# Patient Record
Sex: Female | Born: 1960 | Race: White | Hispanic: No | Marital: Married | Smoking: Current every day smoker
Health system: Southern US, Community
[De-identification: ages and names within clinical notes are randomized; demographics above are authoritative.]

## PROBLEM LIST (undated history)

## (undated) DIAGNOSIS — K2270 Barrett's esophagus without dysplasia: Secondary | ICD-10-CM

## (undated) DIAGNOSIS — K5790 Diverticulosis of intestine, part unspecified, without perforation or abscess without bleeding: Secondary | ICD-10-CM

## (undated) DIAGNOSIS — M542 Cervicalgia: Secondary | ICD-10-CM

## (undated) DIAGNOSIS — M549 Dorsalgia, unspecified: Secondary | ICD-10-CM

## (undated) DIAGNOSIS — K219 Gastro-esophageal reflux disease without esophagitis: Secondary | ICD-10-CM

## (undated) DIAGNOSIS — F17200 Nicotine dependence, unspecified, uncomplicated: Secondary | ICD-10-CM

## (undated) DIAGNOSIS — I10 Essential (primary) hypertension: Secondary | ICD-10-CM

## (undated) DIAGNOSIS — M25562 Pain in left knee: Secondary | ICD-10-CM

## (undated) DIAGNOSIS — N1831 Chronic kidney disease, stage 3a: Secondary | ICD-10-CM

## (undated) DIAGNOSIS — J449 Chronic obstructive pulmonary disease, unspecified: Secondary | ICD-10-CM

## (undated) DIAGNOSIS — M1990 Unspecified osteoarthritis, unspecified site: Secondary | ICD-10-CM

## (undated) DIAGNOSIS — F988 Other specified behavioral and emotional disorders with onset usually occurring in childhood and adolescence: Secondary | ICD-10-CM

## (undated) DIAGNOSIS — E785 Hyperlipidemia, unspecified: Secondary | ICD-10-CM

## (undated) DIAGNOSIS — F411 Generalized anxiety disorder: Secondary | ICD-10-CM

## (undated) DIAGNOSIS — M79646 Pain in unspecified finger(s): Secondary | ICD-10-CM

## (undated) DIAGNOSIS — N179 Acute kidney failure, unspecified: Secondary | ICD-10-CM

## (undated) DIAGNOSIS — R030 Elevated blood-pressure reading, without diagnosis of hypertension: Secondary | ICD-10-CM

## (undated) DIAGNOSIS — R569 Unspecified convulsions: Secondary | ICD-10-CM

## (undated) DIAGNOSIS — G5600 Carpal tunnel syndrome, unspecified upper limb: Secondary | ICD-10-CM

## (undated) DIAGNOSIS — K5909 Other constipation: Secondary | ICD-10-CM

## (undated) DIAGNOSIS — F909 Attention-deficit hyperactivity disorder, unspecified type: Secondary | ICD-10-CM

## (undated) DIAGNOSIS — Z803 Family history of malignant neoplasm of breast: Secondary | ICD-10-CM

## (undated) DIAGNOSIS — L578 Other skin changes due to chronic exposure to nonionizing radiation: Secondary | ICD-10-CM

## (undated) DIAGNOSIS — R0600 Dyspnea, unspecified: Secondary | ICD-10-CM

## (undated) DIAGNOSIS — Z79899 Other long term (current) drug therapy: Secondary | ICD-10-CM

## (undated) DIAGNOSIS — O039 Complete or unspecified spontaneous abortion without complication: Secondary | ICD-10-CM

## (undated) DIAGNOSIS — E7800 Pure hypercholesterolemia, unspecified: Secondary | ICD-10-CM

## (undated) HISTORY — DX: Other specified behavioral and emotional disorders with onset usually occurring in childhood and adolescence: F98.8

## (undated) HISTORY — PX: NO PAST SURGERIES: SHX2092

## (undated) HISTORY — DX: Dorsalgia, unspecified: M54.9

## (undated) HISTORY — DX: Acute kidney failure, unspecified: N17.9

## (undated) HISTORY — DX: Complete or unspecified spontaneous abortion without complication: O03.9

## (undated) HISTORY — DX: Generalized anxiety disorder: F41.1

## (undated) HISTORY — DX: Hyperlipidemia, unspecified: E78.5

## (undated) HISTORY — DX: Carpal tunnel syndrome, unspecified upper limb: G56.00

## (undated) HISTORY — DX: Pain in left knee: M25.562

## (undated) HISTORY — DX: Chronic kidney disease, stage 3a: N18.31

## (undated) HISTORY — DX: Elevated blood-pressure reading, without diagnosis of hypertension: R03.0

## (undated) HISTORY — DX: Chronic obstructive pulmonary disease, unspecified: J44.9

## (undated) HISTORY — PX: COLONOSCOPY: SHX174

## (undated) HISTORY — DX: Gastro-esophageal reflux disease without esophagitis: K21.9

## (undated) HISTORY — DX: Pain in unspecified finger(s): M79.646

## (undated) HISTORY — DX: Diverticulosis of intestine, part unspecified, without perforation or abscess without bleeding: K57.90

## (undated) HISTORY — DX: Barrett's esophagus without dysplasia: K22.70

## (undated) HISTORY — DX: Other constipation: K59.09

## (undated) HISTORY — PX: ESOPHAGOGASTRODUODENOSCOPY: SHX1529

## (undated) HISTORY — DX: Other skin changes due to chronic exposure to nonionizing radiation: L57.8

## (undated) HISTORY — DX: Nicotine dependence, unspecified, uncomplicated: F17.200

## (undated) HISTORY — DX: Pure hypercholesterolemia, unspecified: E78.00

## (undated) HISTORY — DX: Cervicalgia: M54.2

## (undated) HISTORY — DX: Essential (primary) hypertension: I10

## (undated) HISTORY — DX: Dyspnea, unspecified: R06.00

## (undated) HISTORY — DX: Unspecified osteoarthritis, unspecified site: M19.90

## (undated) HISTORY — DX: Family history of malignant neoplasm of breast: Z80.3

---

## 1998-11-13 ENCOUNTER — Ambulatory Visit (HOSPITAL_COMMUNITY): Admission: RE | Admit: 1998-11-13 | Discharge: 1998-11-13 | Payer: Self-pay | Admitting: *Deleted

## 1998-11-13 ENCOUNTER — Encounter: Payer: Self-pay | Admitting: *Deleted

## 2000-03-29 ENCOUNTER — Encounter: Payer: Self-pay | Admitting: Family Medicine

## 2000-03-29 ENCOUNTER — Other Ambulatory Visit: Admission: RE | Admit: 2000-03-29 | Discharge: 2000-03-29 | Payer: Self-pay | Admitting: Family Medicine

## 2000-03-29 LAB — CONVERTED CEMR LAB: Blood Glucose, Fasting: 82 mg/dL

## 2001-08-17 ENCOUNTER — Encounter: Payer: Self-pay | Admitting: Family Medicine

## 2001-08-17 LAB — CONVERTED CEMR LAB
Blood Glucose, Fasting: 79 mg/dL
TSH: 1.31 microintl units/mL

## 2001-10-11 ENCOUNTER — Emergency Department (HOSPITAL_COMMUNITY): Admission: EM | Admit: 2001-10-11 | Discharge: 2001-10-11 | Payer: Self-pay | Admitting: Emergency Medicine

## 2002-05-08 ENCOUNTER — Encounter: Payer: Self-pay | Admitting: Family Medicine

## 2002-05-08 LAB — CONVERTED CEMR LAB

## 2002-05-20 ENCOUNTER — Emergency Department (HOSPITAL_COMMUNITY): Admission: EM | Admit: 2002-05-20 | Discharge: 2002-05-20 | Payer: Self-pay | Admitting: Emergency Medicine

## 2003-02-09 ENCOUNTER — Encounter: Payer: Self-pay | Admitting: Family Medicine

## 2003-02-09 ENCOUNTER — Other Ambulatory Visit: Admission: RE | Admit: 2003-02-09 | Discharge: 2003-02-09 | Payer: Self-pay | Admitting: Family Medicine

## 2003-02-09 LAB — CONVERTED CEMR LAB: Blood Glucose, Fasting: 82 mg/dL

## 2003-02-12 ENCOUNTER — Encounter: Payer: Self-pay | Admitting: Family Medicine

## 2003-02-12 LAB — CONVERTED CEMR LAB: TSH: 1.14 microintl units/mL

## 2003-03-02 ENCOUNTER — Encounter: Admission: RE | Admit: 2003-03-02 | Discharge: 2003-03-02 | Payer: Self-pay | Admitting: Family Medicine

## 2003-04-29 ENCOUNTER — Emergency Department (HOSPITAL_COMMUNITY): Admission: EM | Admit: 2003-04-29 | Discharge: 2003-04-29 | Payer: Self-pay | Admitting: Emergency Medicine

## 2004-02-20 ENCOUNTER — Ambulatory Visit: Payer: Self-pay | Admitting: Family Medicine

## 2004-03-25 ENCOUNTER — Ambulatory Visit: Payer: Self-pay | Admitting: Family Medicine

## 2004-04-03 ENCOUNTER — Encounter: Admission: RE | Admit: 2004-04-03 | Discharge: 2004-04-03 | Payer: Self-pay | Admitting: Family Medicine

## 2004-04-30 ENCOUNTER — Ambulatory Visit: Payer: Self-pay | Admitting: Family Medicine

## 2004-07-22 ENCOUNTER — Ambulatory Visit: Payer: Self-pay | Admitting: Family Medicine

## 2004-07-22 LAB — CONVERTED CEMR LAB: RBC count: 4.16 10*6/uL

## 2005-01-28 ENCOUNTER — Ambulatory Visit: Payer: Self-pay | Admitting: Gastroenterology

## 2005-03-30 ENCOUNTER — Ambulatory Visit: Payer: Self-pay | Admitting: Family Medicine

## 2006-01-11 ENCOUNTER — Ambulatory Visit: Payer: Self-pay | Admitting: Family Medicine

## 2006-01-11 ENCOUNTER — Encounter: Admission: RE | Admit: 2006-01-11 | Discharge: 2006-01-11 | Payer: Self-pay | Admitting: Family Medicine

## 2006-02-25 ENCOUNTER — Encounter
Admission: RE | Admit: 2006-02-25 | Discharge: 2006-05-26 | Payer: Self-pay | Admitting: Physical Medicine and Rehabilitation

## 2006-07-05 ENCOUNTER — Telehealth: Payer: Self-pay | Admitting: Family Medicine

## 2006-07-13 ENCOUNTER — Telehealth: Payer: Self-pay | Admitting: Family Medicine

## 2006-07-29 ENCOUNTER — Telehealth (INDEPENDENT_AMBULATORY_CARE_PROVIDER_SITE_OTHER): Payer: Self-pay | Admitting: *Deleted

## 2006-07-30 ENCOUNTER — Telehealth (INDEPENDENT_AMBULATORY_CARE_PROVIDER_SITE_OTHER): Payer: Self-pay | Admitting: Internal Medicine

## 2006-07-30 ENCOUNTER — Telehealth: Payer: Self-pay | Admitting: Family Medicine

## 2006-09-01 ENCOUNTER — Ambulatory Visit: Payer: Self-pay | Admitting: Gastroenterology

## 2006-10-12 ENCOUNTER — Encounter: Payer: Self-pay | Admitting: Gastroenterology

## 2006-10-12 ENCOUNTER — Ambulatory Visit: Payer: Self-pay | Admitting: Gastroenterology

## 2006-11-18 ENCOUNTER — Encounter: Admission: RE | Admit: 2006-11-18 | Discharge: 2006-11-18 | Payer: Self-pay | Admitting: Family Medicine

## 2006-11-25 ENCOUNTER — Encounter (INDEPENDENT_AMBULATORY_CARE_PROVIDER_SITE_OTHER): Payer: Self-pay | Admitting: *Deleted

## 2007-01-20 ENCOUNTER — Telehealth (INDEPENDENT_AMBULATORY_CARE_PROVIDER_SITE_OTHER): Payer: Self-pay | Admitting: *Deleted

## 2007-02-08 ENCOUNTER — Telehealth: Payer: Self-pay | Admitting: Family Medicine

## 2007-02-14 ENCOUNTER — Encounter: Payer: Self-pay | Admitting: Family Medicine

## 2007-02-14 DIAGNOSIS — F17200 Nicotine dependence, unspecified, uncomplicated: Secondary | ICD-10-CM | POA: Insufficient documentation

## 2007-02-14 DIAGNOSIS — Z780 Asymptomatic menopausal state: Secondary | ICD-10-CM | POA: Insufficient documentation

## 2007-02-14 DIAGNOSIS — K2270 Barrett's esophagus without dysplasia: Secondary | ICD-10-CM | POA: Insufficient documentation

## 2007-02-14 DIAGNOSIS — M549 Dorsalgia, unspecified: Secondary | ICD-10-CM | POA: Insufficient documentation

## 2007-02-14 DIAGNOSIS — G5603 Carpal tunnel syndrome, bilateral upper limbs: Secondary | ICD-10-CM | POA: Insufficient documentation

## 2007-02-14 DIAGNOSIS — G5600 Carpal tunnel syndrome, unspecified upper limb: Secondary | ICD-10-CM | POA: Insufficient documentation

## 2007-02-16 ENCOUNTER — Ambulatory Visit: Payer: Self-pay | Admitting: Family Medicine

## 2007-02-28 ENCOUNTER — Encounter (INDEPENDENT_AMBULATORY_CARE_PROVIDER_SITE_OTHER): Payer: Self-pay | Admitting: *Deleted

## 2007-03-01 ENCOUNTER — Telehealth: Payer: Self-pay | Admitting: Family Medicine

## 2007-03-02 ENCOUNTER — Ambulatory Visit: Payer: Self-pay | Admitting: Family Medicine

## 2007-03-02 ENCOUNTER — Other Ambulatory Visit: Admission: RE | Admit: 2007-03-02 | Discharge: 2007-03-02 | Payer: Self-pay | Admitting: Family Medicine

## 2007-03-02 ENCOUNTER — Telehealth: Payer: Self-pay | Admitting: Family Medicine

## 2007-03-02 ENCOUNTER — Encounter: Payer: Self-pay | Admitting: Family Medicine

## 2007-03-02 DIAGNOSIS — M79609 Pain in unspecified limb: Secondary | ICD-10-CM

## 2007-03-02 DIAGNOSIS — E7800 Pure hypercholesterolemia, unspecified: Secondary | ICD-10-CM | POA: Insufficient documentation

## 2007-03-02 LAB — CONVERTED CEMR LAB: Pap Smear: NORMAL

## 2007-03-07 ENCOUNTER — Encounter (INDEPENDENT_AMBULATORY_CARE_PROVIDER_SITE_OTHER): Payer: Self-pay | Admitting: *Deleted

## 2007-04-01 ENCOUNTER — Telehealth: Payer: Self-pay | Admitting: Family Medicine

## 2007-04-11 ENCOUNTER — Telehealth: Payer: Self-pay | Admitting: Family Medicine

## 2007-04-18 ENCOUNTER — Telehealth: Payer: Self-pay | Admitting: Family Medicine

## 2007-04-22 ENCOUNTER — Ambulatory Visit: Payer: Self-pay | Admitting: Family Medicine

## 2007-04-22 DIAGNOSIS — F988 Other specified behavioral and emotional disorders with onset usually occurring in childhood and adolescence: Secondary | ICD-10-CM | POA: Insufficient documentation

## 2007-05-06 ENCOUNTER — Ambulatory Visit: Payer: Self-pay | Admitting: Family Medicine

## 2007-05-10 ENCOUNTER — Telehealth: Payer: Self-pay | Admitting: Family Medicine

## 2007-05-17 DIAGNOSIS — F411 Generalized anxiety disorder: Secondary | ICD-10-CM | POA: Insufficient documentation

## 2007-05-17 DIAGNOSIS — Z8379 Family history of other diseases of the digestive system: Secondary | ICD-10-CM

## 2007-05-17 DIAGNOSIS — Z8719 Personal history of other diseases of the digestive system: Secondary | ICD-10-CM | POA: Insufficient documentation

## 2007-05-17 HISTORY — DX: Family history of other diseases of the digestive system: Z83.79

## 2007-06-02 ENCOUNTER — Telehealth: Payer: Self-pay | Admitting: Family Medicine

## 2007-06-02 ENCOUNTER — Telehealth: Payer: Self-pay | Admitting: Gastroenterology

## 2007-06-07 ENCOUNTER — Ambulatory Visit: Payer: Self-pay | Admitting: Family Medicine

## 2007-06-07 DIAGNOSIS — M542 Cervicalgia: Secondary | ICD-10-CM

## 2007-06-21 ENCOUNTER — Ambulatory Visit: Payer: Self-pay | Admitting: Family Medicine

## 2007-06-21 DIAGNOSIS — T7840XA Allergy, unspecified, initial encounter: Secondary | ICD-10-CM | POA: Insufficient documentation

## 2007-06-21 DIAGNOSIS — R030 Elevated blood-pressure reading, without diagnosis of hypertension: Secondary | ICD-10-CM

## 2007-06-21 DIAGNOSIS — Z9109 Other allergy status, other than to drugs and biological substances: Secondary | ICD-10-CM | POA: Insufficient documentation

## 2007-06-28 ENCOUNTER — Telehealth: Payer: Self-pay | Admitting: Gastroenterology

## 2007-07-06 ENCOUNTER — Ambulatory Visit: Payer: Self-pay | Admitting: Family Medicine

## 2007-07-14 ENCOUNTER — Telehealth: Payer: Self-pay | Admitting: Family Medicine

## 2007-07-20 ENCOUNTER — Ambulatory Visit: Payer: Self-pay | Admitting: Family Medicine

## 2007-07-20 DIAGNOSIS — L578 Other skin changes due to chronic exposure to nonionizing radiation: Secondary | ICD-10-CM

## 2007-07-20 DIAGNOSIS — IMO0002 Reserved for concepts with insufficient information to code with codable children: Secondary | ICD-10-CM | POA: Insufficient documentation

## 2007-07-20 DIAGNOSIS — M25569 Pain in unspecified knee: Secondary | ICD-10-CM

## 2007-07-26 ENCOUNTER — Telehealth: Payer: Self-pay | Admitting: Family Medicine

## 2007-08-09 ENCOUNTER — Ambulatory Visit: Payer: Self-pay | Admitting: Gastroenterology

## 2007-08-09 DIAGNOSIS — K219 Gastro-esophageal reflux disease without esophagitis: Secondary | ICD-10-CM

## 2007-08-15 ENCOUNTER — Telehealth: Payer: Self-pay | Admitting: Family Medicine

## 2007-09-05 ENCOUNTER — Telehealth: Payer: Self-pay | Admitting: Family Medicine

## 2007-09-08 ENCOUNTER — Ambulatory Visit: Payer: Self-pay | Admitting: Gastroenterology

## 2007-09-21 ENCOUNTER — Telehealth: Payer: Self-pay | Admitting: Family Medicine

## 2007-10-05 ENCOUNTER — Telehealth: Payer: Self-pay | Admitting: Family Medicine

## 2007-10-21 ENCOUNTER — Telehealth: Payer: Self-pay | Admitting: Family Medicine

## 2007-10-24 ENCOUNTER — Ambulatory Visit: Payer: Self-pay | Admitting: Family Medicine

## 2007-10-24 ENCOUNTER — Telehealth: Payer: Self-pay | Admitting: Family Medicine

## 2007-10-24 DIAGNOSIS — J069 Acute upper respiratory infection, unspecified: Secondary | ICD-10-CM | POA: Insufficient documentation

## 2007-10-28 ENCOUNTER — Telehealth: Payer: Self-pay | Admitting: Family Medicine

## 2007-10-28 ENCOUNTER — Telehealth: Payer: Self-pay | Admitting: Gastroenterology

## 2007-11-14 ENCOUNTER — Telehealth: Payer: Self-pay | Admitting: Family Medicine

## 2007-11-21 ENCOUNTER — Encounter: Payer: Self-pay | Admitting: Gastroenterology

## 2007-11-22 ENCOUNTER — Telehealth: Payer: Self-pay | Admitting: Family Medicine

## 2007-12-02 ENCOUNTER — Telehealth: Payer: Self-pay | Admitting: Gastroenterology

## 2007-12-19 ENCOUNTER — Telehealth: Payer: Self-pay | Admitting: Family Medicine

## 2007-12-27 ENCOUNTER — Telehealth: Payer: Self-pay | Admitting: Gastroenterology

## 2008-01-06 ENCOUNTER — Telehealth (INDEPENDENT_AMBULATORY_CARE_PROVIDER_SITE_OTHER): Payer: Self-pay | Admitting: *Deleted

## 2008-01-30 ENCOUNTER — Telehealth: Payer: Self-pay | Admitting: Family Medicine

## 2008-02-01 ENCOUNTER — Telehealth: Payer: Self-pay | Admitting: Family Medicine

## 2008-02-07 ENCOUNTER — Telehealth: Payer: Self-pay | Admitting: Family Medicine

## 2008-02-17 ENCOUNTER — Telehealth: Payer: Self-pay | Admitting: Gastroenterology

## 2008-02-23 ENCOUNTER — Telehealth: Payer: Self-pay | Admitting: Gastroenterology

## 2008-02-28 ENCOUNTER — Ambulatory Visit: Payer: Self-pay | Admitting: Family Medicine

## 2008-02-28 DIAGNOSIS — M62838 Other muscle spasm: Secondary | ICD-10-CM | POA: Insufficient documentation

## 2008-02-28 DIAGNOSIS — M533 Sacrococcygeal disorders, not elsewhere classified: Secondary | ICD-10-CM | POA: Insufficient documentation

## 2008-03-06 ENCOUNTER — Telehealth: Payer: Self-pay | Admitting: Family Medicine

## 2008-03-13 ENCOUNTER — Telehealth: Payer: Self-pay | Admitting: Family Medicine

## 2008-03-30 ENCOUNTER — Telehealth: Payer: Self-pay | Admitting: Family Medicine

## 2008-04-02 ENCOUNTER — Telehealth: Payer: Self-pay | Admitting: Gastroenterology

## 2008-04-04 ENCOUNTER — Telehealth: Payer: Self-pay | Admitting: Family Medicine

## 2008-04-12 ENCOUNTER — Telehealth: Payer: Self-pay | Admitting: Family Medicine

## 2008-04-13 ENCOUNTER — Telehealth: Payer: Self-pay | Admitting: Family Medicine

## 2008-04-30 ENCOUNTER — Telehealth: Payer: Self-pay | Admitting: Gastroenterology

## 2008-05-01 ENCOUNTER — Telehealth: Payer: Self-pay | Admitting: Family Medicine

## 2008-05-09 ENCOUNTER — Telehealth: Payer: Self-pay | Admitting: Family Medicine

## 2008-05-22 ENCOUNTER — Telehealth: Payer: Self-pay | Admitting: Family Medicine

## 2008-05-22 ENCOUNTER — Telehealth: Payer: Self-pay | Admitting: Gastroenterology

## 2008-05-23 ENCOUNTER — Ambulatory Visit: Payer: Self-pay | Admitting: Family Medicine

## 2008-05-30 ENCOUNTER — Telehealth: Payer: Self-pay | Admitting: Family Medicine

## 2008-06-19 ENCOUNTER — Telehealth: Payer: Self-pay | Admitting: Family Medicine

## 2008-06-25 ENCOUNTER — Telehealth: Payer: Self-pay | Admitting: Family Medicine

## 2008-06-28 ENCOUNTER — Telehealth: Payer: Self-pay | Admitting: Gastroenterology

## 2008-07-17 ENCOUNTER — Telehealth: Payer: Self-pay | Admitting: Family Medicine

## 2008-07-18 ENCOUNTER — Telehealth: Payer: Self-pay | Admitting: Family Medicine

## 2008-08-15 ENCOUNTER — Telehealth: Payer: Self-pay | Admitting: Family Medicine

## 2008-09-10 ENCOUNTER — Telehealth: Payer: Self-pay | Admitting: Family Medicine

## 2008-09-18 ENCOUNTER — Encounter: Admission: RE | Admit: 2008-09-18 | Discharge: 2008-09-18 | Payer: Self-pay | Admitting: Family Medicine

## 2008-09-18 LAB — HM MAMMOGRAPHY

## 2008-10-04 ENCOUNTER — Telehealth: Payer: Self-pay | Admitting: Gastroenterology

## 2008-10-09 ENCOUNTER — Telehealth: Payer: Self-pay | Admitting: Family Medicine

## 2008-10-10 ENCOUNTER — Telehealth: Payer: Self-pay | Admitting: Family Medicine

## 2008-10-31 ENCOUNTER — Telehealth: Payer: Self-pay | Admitting: Family Medicine

## 2008-11-05 ENCOUNTER — Telehealth: Payer: Self-pay | Admitting: Family Medicine

## 2008-11-06 ENCOUNTER — Telehealth: Payer: Self-pay | Admitting: Family Medicine

## 2008-11-13 ENCOUNTER — Encounter: Payer: Self-pay | Admitting: Gastroenterology

## 2008-11-27 ENCOUNTER — Telehealth: Payer: Self-pay | Admitting: Gastroenterology

## 2008-12-05 ENCOUNTER — Telehealth: Payer: Self-pay | Admitting: Family Medicine

## 2008-12-05 ENCOUNTER — Telehealth: Payer: Self-pay | Admitting: Gastroenterology

## 2008-12-31 ENCOUNTER — Telehealth: Payer: Self-pay | Admitting: Family Medicine

## 2009-01-01 ENCOUNTER — Ambulatory Visit: Payer: Self-pay | Admitting: Family Medicine

## 2009-01-02 ENCOUNTER — Telehealth: Payer: Self-pay | Admitting: Gastroenterology

## 2009-01-07 ENCOUNTER — Telehealth: Payer: Self-pay | Admitting: Family Medicine

## 2009-01-29 ENCOUNTER — Telehealth: Payer: Self-pay | Admitting: Family Medicine

## 2009-02-25 ENCOUNTER — Telehealth: Payer: Self-pay | Admitting: Family Medicine

## 2009-03-22 ENCOUNTER — Telehealth: Payer: Self-pay | Admitting: Gastroenterology

## 2009-03-26 ENCOUNTER — Telehealth: Payer: Self-pay | Admitting: Family Medicine

## 2009-04-02 ENCOUNTER — Telehealth: Payer: Self-pay | Admitting: Gastroenterology

## 2009-04-23 ENCOUNTER — Telehealth: Payer: Self-pay | Admitting: Family Medicine

## 2009-05-08 ENCOUNTER — Ambulatory Visit: Payer: Self-pay | Admitting: Family Medicine

## 2009-05-13 ENCOUNTER — Encounter: Payer: Self-pay | Admitting: Family Medicine

## 2009-05-21 ENCOUNTER — Telehealth: Payer: Self-pay | Admitting: Family Medicine

## 2009-05-30 ENCOUNTER — Telehealth: Payer: Self-pay | Admitting: Gastroenterology

## 2009-06-06 ENCOUNTER — Telehealth: Payer: Self-pay | Admitting: Family Medicine

## 2009-06-17 ENCOUNTER — Telehealth: Payer: Self-pay | Admitting: Family Medicine

## 2009-06-26 ENCOUNTER — Telehealth: Payer: Self-pay | Admitting: Gastroenterology

## 2009-07-17 ENCOUNTER — Ambulatory Visit: Payer: Self-pay | Admitting: Family Medicine

## 2009-07-18 ENCOUNTER — Telehealth: Payer: Self-pay | Admitting: Family Medicine

## 2009-07-18 ENCOUNTER — Encounter: Payer: Self-pay | Admitting: Family Medicine

## 2009-07-22 ENCOUNTER — Telehealth: Payer: Self-pay | Admitting: Family Medicine

## 2009-07-22 ENCOUNTER — Encounter: Payer: Self-pay | Admitting: Family Medicine

## 2009-08-06 ENCOUNTER — Telehealth: Payer: Self-pay | Admitting: Gastroenterology

## 2009-08-14 ENCOUNTER — Telehealth: Payer: Self-pay | Admitting: Family Medicine

## 2009-08-30 ENCOUNTER — Telehealth: Payer: Self-pay | Admitting: Gastroenterology

## 2009-09-02 ENCOUNTER — Telehealth: Payer: Self-pay | Admitting: Gastroenterology

## 2009-09-03 ENCOUNTER — Encounter: Payer: Self-pay | Admitting: Gastroenterology

## 2009-09-05 ENCOUNTER — Encounter (INDEPENDENT_AMBULATORY_CARE_PROVIDER_SITE_OTHER): Payer: Self-pay | Admitting: *Deleted

## 2009-09-09 ENCOUNTER — Telehealth: Payer: Self-pay | Admitting: Gastroenterology

## 2009-09-09 ENCOUNTER — Telehealth: Payer: Self-pay | Admitting: Family Medicine

## 2009-09-10 ENCOUNTER — Encounter: Payer: Self-pay | Admitting: Gastroenterology

## 2009-10-02 ENCOUNTER — Telehealth: Payer: Self-pay | Admitting: Gastroenterology

## 2009-10-08 ENCOUNTER — Telehealth: Payer: Self-pay | Admitting: Family Medicine

## 2009-10-11 ENCOUNTER — Ambulatory Visit: Payer: Self-pay | Admitting: Family Medicine

## 2009-10-17 ENCOUNTER — Telehealth: Payer: Self-pay | Admitting: Gastroenterology

## 2009-10-23 ENCOUNTER — Encounter: Payer: Self-pay | Admitting: Gastroenterology

## 2009-11-05 ENCOUNTER — Telehealth: Payer: Self-pay | Admitting: Family Medicine

## 2009-12-01 ENCOUNTER — Emergency Department (HOSPITAL_COMMUNITY)
Admission: AC | Admit: 2009-12-01 | Discharge: 2009-12-01 | Payer: Self-pay | Source: Home / Self Care | Admitting: Emergency Medicine

## 2009-12-02 ENCOUNTER — Ambulatory Visit: Payer: Self-pay | Admitting: Family Medicine

## 2009-12-02 ENCOUNTER — Telehealth: Payer: Self-pay | Admitting: Family Medicine

## 2009-12-02 DIAGNOSIS — Z9189 Other specified personal risk factors, not elsewhere classified: Secondary | ICD-10-CM | POA: Insufficient documentation

## 2009-12-02 DIAGNOSIS — F1210 Cannabis abuse, uncomplicated: Secondary | ICD-10-CM | POA: Insufficient documentation

## 2009-12-03 ENCOUNTER — Telehealth: Payer: Self-pay | Admitting: Family Medicine

## 2009-12-16 ENCOUNTER — Encounter: Payer: Self-pay | Admitting: Family Medicine

## 2009-12-24 ENCOUNTER — Telehealth: Payer: Self-pay | Admitting: Gastroenterology

## 2009-12-31 ENCOUNTER — Telehealth: Payer: Self-pay | Admitting: Family Medicine

## 2010-01-28 ENCOUNTER — Telehealth: Payer: Self-pay | Admitting: Family Medicine

## 2010-02-10 ENCOUNTER — Telehealth: Payer: Self-pay | Admitting: Gastroenterology

## 2010-02-23 ENCOUNTER — Encounter: Payer: Self-pay | Admitting: Family Medicine

## 2010-02-26 ENCOUNTER — Telehealth: Payer: Self-pay | Admitting: Family Medicine

## 2010-03-03 ENCOUNTER — Telehealth: Payer: Self-pay | Admitting: Gastroenterology

## 2010-03-04 NOTE — Progress Notes (Signed)
Summary: samples   Phone Note Call from Patient Call back at [phone]   Caller: Patient Call For: Dr. Arlyce Dice Reason for Call: Talk to Nurse Summary of Call: would like more Aciphex samples Initial call taken by: Vallarie Mare,  Jun 26, 2009 9:41 AM  Follow-up for Phone Call        Returned  pts call, put samples out front until she gets her rx from [facility] Follow-up by: Merri Ray CMA Duncan Dull),  Jun 27, 2009 8:29 AM

## 2010-03-04 NOTE — Letter (Signed)
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) KNEE PAIN, LEFT (ICD-719.46) ALLERGY, ENVIRONMENTAL (ICD-995.3) ELEVATED BLOOD PRESSURE (ICD-796.2) ANXIETY (ICD-300.00) NECK PAIN, CHRONIC (ICD-723.1), with degerative changes from C3-C7 CONSTIPATION, CHRONIC, HX OF (ICD-V12.79) ATTENTION DEFICIT DISORDER (ICD-314.00)- meds per psych as of 12/2009 HYPERCHOLESTEROLEMIA (ICD-272.0) THUMB PAIN, BILATERAL (ICD-729.5) BREAST CANCER, FAMILY HX (ICD-V16.3) BACK PAIN, CHRONIC (ICD-724.5) CARPAL TUNNEL SYNDROME, BILATERAL (ICD-354.0) BARRETTS ESOPHAGUS (ICD-530.85) NICOTINE ADDICTION (ICD-305.1) POSTMENOPAUSAL WITHOUT HORMONE REPLACEMENT THERAPY (ICD-V49.81) GERD (12/30/2009 22:44)       Past History:  Past Medical History: Current Problems:  See note from 12/02/09 [id number] meds 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) KNEE PAIN, LEFT (ICD-719.46) ALLERGY, ENVIRONMENTAL (ICD-995.3) ELEVATED BLOOD PRESSURE (ICD-796.2) ANXIETY (ICD-300.00) NECK PAIN, CHRONIC (ICD-723.1), with degerative changes from C3-C7 CONSTIPATION, CHRONIC, HX OF (ICD-V12.79) ATTENTION DEFICIT DISORDER (ICD-314.00)- meds per psych as of 12/2009 HYPERCHOLESTEROLEMIA (ICD-272.0) THUMB PAIN, BILATERAL (ICD-729.5) BREAST CANCER, FAMILY HX (ICD-V16.3) BACK PAIN, CHRONIC (ICD-724.5) CARPAL TUNNEL SYNDROME, BILATERAL (ICD-354.0) BARRETTS ESOPHAGUS (ICD-530.85) NICOTINE ADDICTION (ICD-305.1) POSTMENOPAUSAL WITHOUT HORMONE REPLACEMENT THERAPY  (ICD-V49.81) GERD

## 2010-03-04 NOTE — Letter (Signed)
Summary: Nadara Eaton letter  [place] at [facility]  [address] [place], [place] [address]   Phone: [phone]  Fax: [phone]       09/05/2009 MRN: [id number]  [facility] [address] [place], [place]  [address]  Dear Ms. [facility] [place], and [place] announce the retirement of Arta Silence, M.D., from full-time practice at the [facility] office effective August 01, 2009 and his plans of returning part-time.  It is important to Dr. Hetty Ely and to our practice that you understand that [facility] has seven physicians in our office for your health care needs.  We will continue to offer the same exceptional care that you have today.    Dr. Hetty Ely has spoken to many of you about his plans for retirement and returning part-time in the fall.   We will continue to work with you through the transition to schedule appointments for you in the office and meet the high standards that [place] is committed to.   Again, it is with great pleasure that we share the news that Dr. Hetty Ely will return to [facility] in October of 2011 with a reduced schedule.    If you have any questions, or would like to request an appointment with one of our physicians, please call us at [phone] and press the option for Scheduling an appointment.  We take pleasure in providing you with excellent patient care and look forward to seeing you at your next office visit.  Our [facility] Physicians are:  Tillman Abide, M.D. Laurita Quint, M.D. Roxy Manns, M.D. Kerby Nora, M.D. Hannah Beat, M.D. Ruthe Mannan, M.D. We proudly welcomed Raechel Ache, M.D. and Eustaquio Boyden, M.D. to the practice in July/August 2011.  Sincerely,  [facility]

## 2010-03-04 NOTE — Progress Notes (Signed)
Summary: wants something for her nerves  Phone Note Call from Patient Call back at [phone],  [phone]   Caller: Patient Call For: Shaune Leeks MD Summary of Call: Pt is under some stress with her father now living close by.  She has some personal issues with him and is upset that he is in town.  She is asking if something for anxiety can be called in for her to [place], just a few of something to last until she can come to terms with the situation. Initial call taken by: [name] CMA,  Jun 06, 2009 11:35 AM  Follow-up for Phone Call        Medicine called to [facility]. Follow-up by: [name] CMA,  Jun 06, 2009 2:33 PM    New/Updated Medications: ATIVAN 1 MG TABS (LORAZEPAM) 1 tab by mouth two times a day as needed anxiety Prescriptions: ATIVAN 1 MG TABS (LORAZEPAM) 1 tab by mouth two times a day as needed anxiety  #30 x 0   Entered and Authorized by:   Shaune Leeks MD   Signed by:   [name] CMA on 06/06/2009   Method used:   Telephoned to ...         RxID:   [id number]

## 2010-03-04 NOTE — Progress Notes (Signed)
Summary: sample request   Phone Note Call from Patient Call back at [phone]   Caller: Patient Call For: Dr. Arlyce Dice Reason for Call: Talk to Nurse Summary of Call: would like to pick up Aciphex samples... going [place] of [place] soon Initial call taken by: Vallarie Mare,  September 09, 2009 11:47 AM  Follow-up for Phone Call        Called pt to inform that samples would be out front. Pt stated she was going to [place] for a week. Explained to her that [facility] resent all paperwork for her Aciphex to be redone because the script was out of date, just resent back in on 8-2 Follow-up by: Merri Ray CMA Duncan Dull),  September 09, 2009 1:35 PM

## 2010-03-04 NOTE — Progress Notes (Signed)
Summary: Aciphex from [facility]   Phone Note Outgoing Call Call back at [facility] Phone [phone]   Call placed by: Merri Ray CMA Duncan Dull),  April 02, 2009 10:44 AM Summary of Call: Called pt to inform that pts Aciphex from [facility] are in, Initial call taken by: Merri Ray CMA Duncan Dull),  April 02, 2009 10:44 AM

## 2010-03-04 NOTE — Progress Notes (Signed)
Summary: needs refills on vicodin, adderall  Phone Note Refill Request Call back at Work Phone [phone] Call back at [phone] Message from:  Patient  Refills Requested: Medication #1:  VICODIN ES 7.5-750 MG TABS Take 1-2 tablet by mouth three times a day sparingly as needed for pain Max 5 tablets daily  Medication #2:  ADDERALL 30 MG  TABS one tab by mouth three times a day Please call pt when ready.  Initial call taken by: [name] CMA,  May 21, 2009 10:32 AM  Follow-up for Phone Call        Pt to pick up scripts. Follow-up by: [name] CMA,  May 21, 2009 3:56 PM    Prescriptions: ADDERALL 30 MG  TABS (AMPHETAMINE-DEXTROAMPHETAMINE) one tab by mouth three times a day  #90 x 0   Entered and Authorized by:   Shaune Leeks MD   Signed by:   Shaune Leeks MD on 05/21/2009   Method used:   Print then Give to Patient   RxID:   [id number] VICODIN ES 7.5-750 MG TABS (HYDROCODONE-ACETAMINOPHEN) Take 1-2 tablet by mouth three times a day sparingly as needed for pain Max 5 tablets daily  #150 x 0   Entered and Authorized by:   Shaune Leeks MD   Signed by:   Shaune Leeks MD on 05/21/2009   Method used:   Print then Give to Patient   RxID:   [id number]

## 2010-03-04 NOTE — Medication Information (Signed)
Summary: Aciphex/[facility].  Aciphex/[facility].   Imported By: Sherian Rein 10/01/2009 08:48:19  _____________________________________________________________________  External Attachment:    Type:   Image     Comment:   External Document

## 2010-03-04 NOTE — Progress Notes (Signed)
Summary: refil request for vicodin  Phone Note Refill Request Message from:  Fax from Pharmacy  Refills Requested: Medication #1:  VICODIN ES 7.5-750 MG TABS Take 1-2 tablet by mouth three times a day sparingly as needed for pain Max 5 tablets daily- talk to Dr. Para March before rxing   Last Refilled: 12/04/2009 Faxed request from [facility].     Initial call taken by: [name] CMA, AAMA,  December 31, 2009 11:19 AM  Follow-up for Phone Call         please call in.  VICODIN ES 7.5-750 MG TABS Take 1-2 tablet by mouth three times a day sparingly as needed for pain Max 5 tablets daily,  ~150, no rf.   thanks.  Follow-up by: Crawford Givens MD,  December 31, 2009 1:33 PM  Additional Follow-up for Phone Call Additional follow up Details #1::        Medication phoned to pharmacy.  Additional Follow-up by: Delilah Shan CMA Duncan Dull),  December 31, 2009 4:52 PM

## 2010-03-04 NOTE — Progress Notes (Signed)
Summary: Rx Adderall and Vicodin  Phone Note Call from Patient   Caller: Patient Call For: [phone] Reason for Call: Refill Medication Summary of Call: Need medication refill for Adderral and Vicodin.[name] KitchenMarland KitchenPts call back # [phone].[name] KitchenDaine Gip  Jun 17, 2009 3:47 PM  Initial call taken by: Daine Gip,  Jun 17, 2009 3:47 PM  Follow-up for Phone Call        Scripts written. Not really due until the 19th. Follow-up by: Shaune Leeks MD,  Jun 17, 2009 4:54 PM  Additional Follow-up for Phone Call Additional follow up Details #1::        Patient notified as instructed by telephone. Rx's up front and ready for pickup. Additional Follow-up by: Sydell Axon LPN,  Jun 17, 2009 5:34 PM    Prescriptions: VICODIN ES 7.5-750 MG TABS (HYDROCODONE-ACETAMINOPHEN) Take 1-2 tablet by mouth three times a day sparingly as needed for pain Max 5 tablets daily  #150 x 0   Entered and Authorized by:   Shaune Leeks MD   Signed by:   Shaune Leeks MD on 06/17/2009   Method used:   Print then Give to Patient   RxID:   [id number] ADDERALL 30 MG  TABS (AMPHETAMINE-DEXTROAMPHETAMINE) one tab by mouth three times a day  #90 x 0   Entered and Authorized by:   Shaune Leeks MD   Signed by:   Shaune Leeks MD on 06/17/2009   Method used:   Print then Give to Patient   RxID:   [id number]

## 2010-03-04 NOTE — Progress Notes (Signed)
Summary: Aciphex from [facility]   Phone Note Outgoing Call Call back at [facility] Phone [phone]   Call placed by: Merri Ray CMA Duncan Dull),  October 17, 2009 3:33 PM Summary of Call: Called pt to inform more Aciphex is here from the [facility]. Initial call taken by: Merri Ray CMA Duncan Dull),  October 17, 2009 3:34 PM

## 2010-03-04 NOTE — Letter (Signed)
Summary: Endoscopy Letter  [facility]  [address] [place], [place] [address]   Phone: [phone]  Fax: [phone]      October 23, 2009 MRN: [id number]   MITTIE KNITTEL [address] [place], [place]  [address]   Dear Ms. Muralles,   According to your medical record, it is time for you to schedule an Endoscopy. Endoscopic screening is recommended for patients with certain upper digestive tract conditions because of associated increased risk for cancers of the upper digestive system.  This letter has been generated based on the recommendations made at the time of your prior procedure. If you feel that in your particular situation this may no longer apply, please contact our office.  Please call our office at [phone]) to schedule this appointment or to update your records at your earliest convenience.  Thank you for cooperating with [place] to provide you with the very best care possible.   Sincerely,  Barbette Hair. Arlyce Dice, M.D.  [facility] [phone]

## 2010-03-04 NOTE — Progress Notes (Signed)
Summary: written rx and note  Phone Note Refill Request Call back at [phone] Message from:  Patient on July 18, 2009 9:09 AM  Refills Requested: Medication #1:  VICODIN ES 7.5-750 MG TABS Take 1-2 tablet by mouth three times a day sparingly as needed for pain Max 5 tablets daily  Medication #2:  ADDERALL 30 MG  TABS one tab by mouth three times a day Patient needs written script and a note for work as well. She says that she forgot to ask you yesterday because she was in shock after hearing that you were leaving at the end of the month. She says that she would like to be out of work through the weekend because she works 12 hrs shift and thinks it will be too much for her. Please call patient when ready.   Initial call taken by: Melody Comas,  July 18, 2009 9:11 AM Caller: Patient Call For: Shaune Leeks MD  Follow-up for Phone Call        Advised patient of rx and note. Placed in front office.  Follow-up by: Melody Comas,  July 18, 2009 1:51 PM    Prescriptions: ADDERALL 30 MG  TABS (AMPHETAMINE-DEXTROAMPHETAMINE) one tab by mouth three times a day  #90 x 0   Entered and Authorized by:   Shaune Leeks MD   Signed by:   Shaune Leeks MD on 07/18/2009   Method used:   Print then Give to Patient   RxID:   [id number] VICODIN ES 7.5-750 MG TABS (HYDROCODONE-ACETAMINOPHEN) Take 1-2 tablet by mouth three times a day sparingly as needed for pain Max 5 tablets daily  #150 x 0   Entered and Authorized by:   Shaune Leeks MD   Signed by:   Shaune Leeks MD on 07/18/2009   Method used:   Print then Give to Patient   RxID:   [id number]

## 2010-03-04 NOTE — Progress Notes (Signed)
Summary: Samples of Aciphex   Phone Note Call from Patient Call back at [phone]   Caller: Patient Call For: Dr. Arlyce Dice Summary of Call: Wants to know if we have any samples of Aciphex Initial call taken by: Karna Christmas,  August 06, 2009 8:41 AM  Follow-up for Phone Call        Returned pts call, explained to her that her samples will be out front,She just mailed off forms to Patient Assistance Program for her Aciphex Follow-up by: Merri Ray CMA Duncan Dull),  August 07, 2009 8:42 AM

## 2010-03-04 NOTE — Progress Notes (Signed)
Summary: Rx Adderall and Vicodin  Phone Note Call from Patient Call back at [phone]   Caller: Patient Call For: Dr. Para March Summary of Call: Patient needs a refill on her Adderall and Hydrocodone. Call when rx's are ready. Initial call taken by: Sydell Axon LPN,  October 08, 2009 9:51 AM  Follow-up for Phone Call        deny both.  see 08/14/09 note.  have patient schedule appointment.   Follow-up by: Crawford Givens MD,  October 08, 2009 2:11 PM  Additional Follow-up for Phone Call Additional follow up Details #1::        Patient Advised.  Lugene Fuquay CMA (AAMA)  October 08, 2009 2:26 PM

## 2010-03-04 NOTE — Progress Notes (Signed)
Summary: needs early refills  Phone Note Refill Request Call back at [phone] Message from:  Patient  Refills Requested: Medication #1:  VICODIN ES 7.5-750 MG TABS Take 1-2 tablet by mouth three times a day sparingly as needed for pain Max 5 tablets daily  Medication #2:  ADDERALL 30 MG  TABS one tab by mouth three times a day Pt's brother in law died so she has to fly to [place] [place].  She is asking for early refills on meds.  Also, she is asking for a couple of valium to help her with the flight and funeral.  Initial call taken by: [name] CMA,  September 09, 2009 8:40 AM  Follow-up for Phone Call        signed.  I printed extra rxs for ativan.  She can take that as needed for flight and anxiety.  I would not use 2 different benzodiazepines.  Follow-up by: Crawford Givens MD,  September 09, 2009 8:59 AM  Additional Follow-up for Phone Call Additional follow up Details #1::        Patient Advised. Prescription left at front desk.  Additional Follow-up by: Delilah Shan CMA (AAMA),  September 09, 2009 10:09 AM    Prescriptions: ATIVAN 1 MG TABS (LORAZEPAM) 1 tab by mouth two times a day as needed anxiety  #15 x 0   Entered and Authorized by:   Crawford Givens MD   Signed by:   Crawford Givens MD on 09/09/2009   Method used:   Print then Give to Patient   RxID:   [id number] ADDERALL 30 MG  TABS (AMPHETAMINE-DEXTROAMPHETAMINE) one tab by mouth three times a day  #90 x 0   Entered and Authorized by:   Crawford Givens MD   Signed by:   Crawford Givens MD on 09/09/2009   Method used:   Print then Give to Patient   RxID:   [id number] VICODIN ES 7.5-750 MG TABS (HYDROCODONE-ACETAMINOPHEN) Take 1-2 tablet by mouth three times a day sparingly as needed for pain Max 5 tablets daily  #150 x 0   Entered and Authorized by:   Crawford Givens MD   Signed by:   Crawford Givens MD on 09/09/2009   Method used:   Print then Give to Patient   RxID:   [id number]

## 2010-03-04 NOTE — Progress Notes (Signed)
Summary: refill request for vicodin, adderall  Phone Note Refill Request Call back at [phone] Message from:  Patient  Refills Requested: Medication #1:  ADDERALL 30 MG  TABS one tab by mouth three times a day  Medication #2:  VICODIN ES 7.5-750 MG TABS Take 1-2 tablet by mouth three times a day sparingly as needed for pain Max 5 tablets daily Please call pt when ready, she is going out of town, asking for these ASAP.  Initial call taken by: [name] CMA,  April 23, 2009 10:46 AM    Prescriptions: VICODIN ES 7.5-750 MG TABS (HYDROCODONE-ACETAMINOPHEN) Take 1-2 tablet by mouth three times a day sparingly as needed for pain Max 5 tablets daily  #150 x 0   Entered and Authorized by:   Ruthe Mannan MD   Signed by:   Ruthe Mannan MD on 04/23/2009   Method used:   Print then Give to Patient   RxID:   [id number] ADDERALL 30 MG  TABS (AMPHETAMINE-DEXTROAMPHETAMINE) one tab by mouth three times a day  #90 x 0   Entered and Authorized by:   Ruthe Mannan MD   Signed by:   Ruthe Mannan MD on 04/23/2009   Method used:   Print then Give to Patient   RxID:   [id number]   Appended Document: refill request for vicodin, adderall Patient Advised. Prescription left at front desk.

## 2010-03-04 NOTE — Medication Information (Signed)
Summary: Assistance forms/[facility]  Assistance forms/[employer]   Imported By: Sherian Rein 09/06/2009 10:34:20  _____________________________________________________________________  External Attachment:    Type:   Image     Comment:   External Document

## 2010-03-04 NOTE — Progress Notes (Signed)
Summary: refill   Phone Note Call from Patient Call back at [phone]   Caller: Patient Call For: Arlyce Dice Reason for Call: Refill Medication, Talk to Nurse Summary of Call: Patient needs refill for her Aciphex would like to speak to CMA first. Initial call taken by: Tawni Levy,  August 30, 2009 8:41 AM  Follow-up for Phone Call        Returned pts call. L/M for pt to return my call Follow-up by: Merri Ray CMA Duncan Dull),  August 30, 2009 1:36 PM  Additional Follow-up for Phone Call Additional follow up Details #1::        Pt wanted to know if her Aciphex from [facility] has came in. Explained to her that she needs to call [facility] and see what is taking so long for the delivery,all paperwork already been submitted. Pt to call [facility]. We are temporarily out of samples. Additional Follow-up by: Merri Ray CMA Duncan Dull),  August 30, 2009 1:53 PM

## 2010-03-04 NOTE — Progress Notes (Signed)
Summary: needs refills on adderall, vicodin  Phone Note Refill Request Call back at Home Phone [phone] Message from:  Patient  Refills Requested: Medication #1:  VICODIN ES 7.5-750 MG TABS Take 1-2 tablet by mouth three times a day sparingly as needed for pain Max 5 tablets daily  Medication #2:  ADDERALL 30 MG  TABS one tab by mouth three times a day Please call when ready.  Initial call taken by: [name] CMA,  August 14, 2009 10:38 AM  Follow-up for Phone Call        okay to fill both with the same sig as previous.  1 month supply on both, #150 of vicodin and #90 of adderall.  Have patient schedule for 30 min appointment with me in the next month.  please print rxs for me to sign.  thanks.  Follow-up by: Crawford Givens MD,  August 14, 2009 11:26 AM     Appended Document: needs refills on adderall, vicodin In your in box for signature.  Appended Document: needs refills on adderall, vicodin Signed, please have patient pick up.   Appended Document: needs refills on adderall, vicodin Patient Advised. Prescription left at front desk.

## 2010-03-04 NOTE — Progress Notes (Signed)
Summary: refill request for vicodin and adderall  Phone Note Refill Request Call back at Work Phone [phone] Call back at [phone] Message from:  Patient  Refills Requested: Medication #1:  VICODIN ES 7.5-750 MG TABS Take 1-2 tablet by mouth three times a day sparingly as needed for pain Max 5 tablets daily  Medication #2:  ADDERALL 30 MG  TABS one tab by mouth three times a day Please call pt when ready.  Initial call taken by: [name] CMA,  March 26, 2009 12:30 PM  Follow-up for Phone Call        Advised pt ok to pick up. Follow-up by: [name] CMA,  March 26, 2009 12:58 PM    Prescriptions: ADDERALL 30 MG  TABS (AMPHETAMINE-DEXTROAMPHETAMINE) one tab by mouth three times a day  #90 x 0   Entered and Authorized by:   Shaune Leeks MD   Signed by:   Shaune Leeks MD on 03/26/2009   Method used:   Print then Give to Patient   RxID:   [id number] VICODIN ES 7.5-750 MG TABS (HYDROCODONE-ACETAMINOPHEN) Take 1-2 tablet by mouth three times a day sparingly as needed for pain Max 5 tablets daily  #150 x 0   Entered and Authorized by:   Shaune Leeks MD   Signed by:   Shaune Leeks MD on 03/26/2009   Method used:   Print then Give to Patient   RxID:   [id number]
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
## 2010-03-04 NOTE — Progress Notes (Signed)
Summary: Aciphex here from PAP   Phone Note Outgoing Call Call back at [facility] Phone [phone]   Call placed by: Merri Ray CMA Duncan Dull),  October 02, 2009 1:56 PM Summary of Call: Called pt to inform that Aciphex was here from the patient assistant program Initial call taken by: Merri Ray CMA Duncan Dull),  October 02, 2009 1:57 PM

## 2010-03-04 NOTE — Progress Notes (Signed)
Summary: Aciphex   Phone Note Call from Patient Call back at [phone]   Caller: Patient Call For: Dr. Arlyce Dice Reason for Call: Refill Medication Summary of Call: pt would like to know if her Aciphex came in and if not, can she have some samples Initial call taken by: Vallarie Mare,  March 22, 2009 10:37 AM  Follow-up for Phone Call        Aciphex has not came in yet can come get samples Follow-up by: Merri Ray CMA Duncan Dull),  March 22, 2009 10:53 AM
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
## 2010-03-04 NOTE — Progress Notes (Signed)
Summary: refill request for vicodin  Phone Note Refill Request Message from:  Fax from Pharmacy  Refills Requested: Medication #1:  VICODIN ES 7.5-750 MG TABS Take 1-2 tablet by mouth three times a day sparingly as needed for pain Max 5 tablets daily- talk to Dr. Para March before rxing   Last Refilled: 11/05/2009 Faxed request from [facility].  Initial call taken by: [name] CMA, AAMA,  December 03, 2009 2:32 PM  Follow-up for Phone Call        please call in.  VICODIN ES 7.5-750 MG TABS Take 1-2 tablet by mouth three times a day sparingly as needed for pain Max 5 tablets daily,  ~150, no rf.  Follow-up by: Crawford Givens MD,  December 03, 2009 10:33 PM  Additional Follow-up for Phone Call Additional follow up Details #1::        Rx called in as directed Additional Follow-up by: Janee Morn CMA Duncan Dull),  December 04, 2009 2:57 PM
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
## 2010-03-04 NOTE — Progress Notes (Signed)
Summary: Samples of Aciphex   Phone Note Call from Patient Call back at [phone]   Caller: Patient Call For: Dr. Arlyce Dice Reason for Call: Talk to Nurse Summary of Call: Asking for samples of Aciphex Initial call taken by: Karna Christmas,  May 30, 2009 2:47 PM  Follow-up for Phone Call        Called pt to inform Dr Arlyce Dice will sign PAP forms on Monday that she could pick up samples today. Will mail her forms to her once Dr Nita Sells signs Follow-up by: Merri Ray CMA Duncan Dull),  May 30, 2009 3:03 PM     Appended Document: Samples of Aciphex Mailed pt her PAP forms today

## 2010-03-04 NOTE — Progress Notes (Signed)
Summary: needs refills on vicodin, adderall  Phone Note Refill Request Call back at [phone] Message from:  Patient  Refills Requested: Medication #1:  VICODIN ES 7.5-750 MG TABS Take 1-2 tablet by mouth three times a day sparingly as needed for pain Max 5 tablets daily  Medication #2:  ADDERALL 30 MG  TABS one tab by mouth three times a day Initial call taken by: [name] CMA,  November 05, 2009 9:11 AM  Follow-up for Phone Call        rxs signed.  Follow-up by: Crawford Givens MD,  November 05, 2009 2:54 PM    Prescriptions: ADDERALL 30 MG  TABS (AMPHETAMINE-DEXTROAMPHETAMINE) one tab by mouth three times a day  #90 x 0   Entered and Authorized by:   Crawford Givens MD   Signed by:   Crawford Givens MD on 11/05/2009   Method used:   Print then Give to Patient   RxID:   [id number] VICODIN ES 7.5-750 MG TABS (HYDROCODONE-ACETAMINOPHEN) Take 1-2 tablet by mouth three times a day sparingly as needed for pain Max 5 tablets daily  #150 x 0   Entered and Authorized by:   Crawford Givens MD   Signed by:   Crawford Givens MD on 11/05/2009   Method used:   Print then Give to Patient   RxID:   [id number]

## 2010-03-04 NOTE — Progress Notes (Signed)
Summary: RX Vicodin and Adderall  Phone Note Refill Request Call back at Work Phone [phone] Message from:  Patient on February 25, 2009 10:39 AM  Refills Requested: Medication #1:  ADDERALL 30 MG  TABS one tab by mouth three times a day  Medication #2:  VICODIN ES 7.5-750 MG TABS Take 1-2 tablet by mouth three times a day sparingly as needed for pain Max 5 tablets daily Patient has new insurance company and needs to come in a pick up written scripts so that she can mail them in. Please call patient when ready for pick up.    Method Requested: Pick up at Office Initial call taken by: Melody Comas,  February 25, 2009 10:41 AM  Follow-up for Phone Call        Patient notified that rx is up front and ready for pickup. Follow-up by: Sydell Axon LPN,  February 25, 2009 3:25 PM    Prescriptions: ADDERALL 30 MG  TABS (AMPHETAMINE-DEXTROAMPHETAMINE) one tab by mouth three times a day  #90 x 0   Entered and Authorized by:   Shaune Leeks MD   Signed by:   Shaune Leeks MD on 02/25/2009   Method used:   Print then Give to Patient   RxID:   [id number] VICODIN ES 7.5-750 MG TABS (HYDROCODONE-ACETAMINOPHEN) Take 1-2 tablet by mouth three times a day sparingly as needed for pain Max 5 tablets daily  #150 x 0   Entered and Authorized by:   Shaune Leeks MD   Signed by:   Shaune Leeks MD on 02/25/2009   Method used:   Print then Give to Patient   RxID:   [id number]

## 2010-03-04 NOTE — Letter (Signed)
Summary: Out of Work  [employer] at [facility]  [address] [place], [place] [address]   Phone: [phone]  Fax: [phone]    July 18, 2009   Employee:  Cariann Lasala    To Whom It May Concern:   For Medical reasons, please excuse the above named employee from work for the following dates:  Start:   07/17/2009  End:   07/21/2009  If you need additional information, please feel free to contact our office.         Sincerely,    Shaune Leeks MD

## 2010-03-04 NOTE — Progress Notes (Signed)
Summary: Are her meds here?   Phone Note Call from Patient Call back at [facility] Phone [phone]   Call For: Dr Arlyce Dice Summary of Call: Wonders if her medicine has arrived yet. Initial call taken by: Leanor Kail [facility],  December 24, 2009 10:56 AM  Follow-up for Phone Call        Returned pts call, explained to pt that we have not revieved her meds from PAP yet offered pt samples until med gets here Follow-up by: Merri Ray CMA Duncan Dull),  December 24, 2009 1:50 PM

## 2010-03-04 NOTE — Progress Notes (Signed)
Summary: Resend app for PAP   Phone Note Other Incoming Call back at [employer] [facility]   Summary of Call: Recieved fax from PAP regarding Melanies Aciphex, Need to resend with a current dat and a written prescription with application and remail.  Initial call taken by: Merri Ray CMA Duncan Dull),  September 02, 2009 10:24 AM  Follow-up for Phone Call        Will get Dr Arlyce Dice to sign original signeitures and remail Follow-up by: Merri Ray CMA Duncan Dull),  September 02, 2009 10:42 AM    Prescriptions: ACIPHEX 20 MG TBEC (RABEPRAZOLE SODIUM) Take 1 tablet by mouth two times a day  #60 x 11   Entered by:   Merri Ray CMA (AAMA)   Authorized by:   Louis Meckel MD   Signed by:   Merri Ray CMA (AAMA) on 09/02/2009   Method used:   Print then Give to Patient   RxID:   [id number]

## 2010-03-04 NOTE — Progress Notes (Signed)
Summary: note for work  Phone Note Call from Patient Call back at [phone]   Caller: Patient Call For: Shaune Leeks MD Summary of Call: Patient is asking if she can get a note for work that is dated for the 12th through the 20th  instead of the 15th through the 19th.  Initial call taken by: Melody Comas,  July 22, 2009 9:10 AM  Follow-up for Phone Call        written. Shaune Leeks MD  July 22, 2009 1:54 PM   Patient advised, letter in front office.  Follow-up by: Melody Comas,  July 22, 2009 2:24 PM

## 2010-03-04 NOTE — Assessment & Plan Note (Signed)
Summary: CONGESTION,ST/CLE   Vital Signs:  Patient profile:   50 year old female Weight:      145.25 pounds Temp:     98.1 degrees F oral Pulse rate:   72 / minute Pulse rhythm:   regular BP sitting:   124 / 80  (left arm) Cuff size:   regular  Vitals Entered By: Sydell Axon LPN (May 09, 5282 10:42 AM) CC: Nasal congestion/yellow-clear, productive cough/yellow, sore throat and no voice for 3 weeks   History of Present Illness: Pt here for congestion, has had loss of voice for three weeks and works in [employer]. Her sxs have been going on for three weeks. She has no headache, she has been "hot"  and has had chills esp at night, no ear pain, ST that has been the major problem, no rhinitis, some nasal congestion, cough that ids proiductive of fowl mucous, some SOB, mild nausea that is not uncommon and hon  and hoarseness that is better today. She has taken IBP and Tyl And ASA and has used cough drops.   Problems Prior to Update: 1)  Muscle Spasm, Trapezius Muscle, Right  (ICD-728.85) 2)  Coccygeal Pain  (ICD-724.79) 3)  Uri  (ICD-465.9) 4)  Fm Hx Malignant Neoplasm Gastrointestinal Tract  (ICD-V16.0) 5)  Esophageal Reflux  (ICD-530.81) 6)  Abrasion, Head  (ICD-910.0) 7)  Other Dermatitis Due To Solar Radiation  (ICD-692.79) 8)  Knee Pain, Left  (ICD-719.46) 9)  Allergy, Environmental  (ICD-995.3) 10)  Elevated Blood Pressure  (ICD-796.2) 11)  Anxiety  (ICD-300.00) 12)  Neck Pain, Chronic  (ICD-723.1) 13)  Constipation, Chronic, Hx of  (ICD-V12.79) 14)  Attention Deficit Disorder  (ICD-314.00) 15)  Hypercholesterolemia  (ICD-272.0) 16)  Thumb Pain, Bilateral  (ICD-729.5) 17)  Breast Cancer, Family Hx  (ICD-V16.3) 18)  Back Pain, Chronic  (ICD-724.5) 19)  Carpal Tunnel Syndrome, Bilateral  (ICD-354.0) 20)  Barretts Esophagus  (ICD-530.85) 21)  Nicotine Addiction  (ICD-305.1) 22)  Postmenopausal Without Hormone Replacement Therapy  (ICD-V49.81)  Medications Prior to Update: 1)   Vicodin Es 7.5-750 Mg Tabs (Hydrocodone-Acetaminophen) .... Take 1-2 Tablet By Mouth Three Times A Day Sparingly As Needed For Pain Max 5 Tablets Daily 2)  Aciphex 20 Mg Tbec (Rabeprazole Sodium) .... Take 1 Tablet By Mouth Bid 3)  Adderall 30 Mg  Tabs (Amphetamine-Dextroamphetamine) .... One Tab By Mouth Three Times A Day 4)  Ventolin Hfa 108 (90 Base) Mcg/act Aers (Albuterol Sulfate) .... Two Inhalations One Minute Apart Two Times A Day As Needed 5)  Flexeril 10 Mg Tabs (Cyclobenzaprine Hcl) .... One Tab By Mouth Three Times A Day As Needed Muscle Spasm  Allergies: 1)  ! * Bee Stings  Physical Exam  General:  Well-developed,well-nourished,in no acute distress; alert,appropriate and cooperative throughout examination, mildly hoarse but her voice is always slightly "Husky" Head:  TTP over frontal sinuses, bilaterally. Sinuses NT. Eyes:  Conjunctiva clear bilaterally.  Ears:  R ear normal and L ear normal.   Nose:  External nasal examination shows no deformity or inflammation. Nasal mucosa are pink and moist without lesions or exudates. R nare slightly inflamed and narrowed. Mouth:  Oral mucosa and oropharynx without lesions or exudates.  Teeth in good repair. Minimal injection of the pharyngeal area but very clean. Neck:  No deformities, masses, or tenderness noted. Lungs:  normal respiratory effort, no wheezes, no crackles.   Heart:  Normal rate and regular rhythm. S1 and S2 normal without gallop, murmur, click, rub or other extra  sounds.   Impression & Recommendations:  Problem # 1:  LARYNGITIS-ACUTE (ICD-464.00) Assessment New Will treat as acute pharyngitis due to length of time. Is smoker and encouraged to quit. May need ENT referrakl to look at vocal chords if sxs cont due to smoking but sxs sound iminflammatory.  Complete Medication List: 1)  Vicodin Es 7.5-750 Mg Tabs (Hydrocodone-acetaminophen) .... Take 1-2 tablet by mouth three times a day sparingly as needed for pain max 5  tablets daily 2)  Aciphex 20 Mg Tbec (Rabeprazole sodium) .... Take 1 tablet by mouth two times a day 3)  Adderall 30 Mg Tabs (Amphetamine-dextroamphetamine) .... One tab by mouth three times a day 4)  Ventolin Hfa 108 (90 Base) Mcg/act Aers (Albuterol sulfate) .... Two inhalations one minute apart two times a day as needed 5)  Flexeril 10 Mg Tabs (Cyclobenzaprine hcl) .... One tab by mouth three times a day as needed muscle spasm 6)  Amoxicillin 500 Mg Caps (Amoxicillin) .... One tab by mouth three times a day  Patient Instructions: 1)  Take Amox. 2)  Gargle every 1/2 hr for two days. 3)  Tyl Es 2 tabs three times a day  4)  Keep lozenge in mouth. Prescriptions: AMOXICILLIN 500 MG CAPS (AMOXICILLIN) one tab by mouth three times a day  #30 x 0   Entered and Authorized by:   Shaune Leeks MD   Signed by:   Shaune Leeks MD on 05/08/2009   Method used:   Electronically to        [facility]* (retail)       [address]       [place], [place]  [address]       Ph: [phone]       Fax: [phone]   RxID:   [id number]   Current Allergies (reviewed today): ! * BEE STINGS

## 2010-03-04 NOTE — Letter (Signed)
Summary: Out of Work  [facility] at [facility]  [address] [place], [place] [address]   Phone: [phone]  Fax: [phone]    May 13, 2009   Employee:  Cristina Myers    To Whom It May Concern:   For Medical reasons, please excuse the above named employee from work for the following dates:  Start:   05/08/09   End:   05/11/09  If you need additional information, please feel free to contact our office.         Sincerely,    Shaune Leeks MD

## 2010-03-04 NOTE — Letter (Signed)
Summary: Out of Work  [facility] at [facility]  [address] [place], [place] [address]   Phone: [phone]  Fax: [phone]    July 22, 2009   Employee:  Ambar Vanderburg    To Whom It May Concern:   For Medical reasons, please excuse the above named employee from work for the following dates:  Start:   07/14/2009  End:   07/22/2009  Pt was seen 07/17/2009 and evaluated.  If you need additional information, please feel free to contact our office.         Sincerely,    Shaune Leeks MD

## 2010-03-04 NOTE — Progress Notes (Signed)
Summary: Appt scheduled  Phone Note Call from Patient Call back at [phone]   Caller: Patient Call For: Crawford Givens MD Action Taken: Patient advised to go to ER Summary of Call: Patient wanted to let you know that she has made an appt at [facility] with Dr. Tiajuana Amass on November 14 at 2:00. Initial call taken by: Sydell Axon LPN,  December 02, 2009 2:00 PM  Follow-up for Phone Call        Noted.

## 2010-03-06 NOTE — Progress Notes (Signed)
Summary: vicodin  Phone Note Refill Request Message from:  Fax from Pharmacy on February 26, 2010 1:11 PM  Refills Requested: Medication #1:  VICODIN ES 7.5-750 MG TABS Take 1-2 tablet by mouth three times a day sparingly as needed for pain Max 5 tablets daily- talk to Dr. Para March before rxing   Last Refilled: 01/29/2010 Refill request from [facility]. [phone]  Initial call taken by: Melody Comas,  February 26, 2010 1:12 PM  Follow-up for Phone Call        Please  call in vicodin 7.5/750mg .  Take 1-2 tablets by mouth three times a day sparingly as needed for pain Max 5 tablets daily, #150, no rf.  Have patient schedule OV with me in next month.  thanks.  Follow-up by: Crawford Givens MD,  February 26, 2010 5:37 PM  Additional Follow-up for Phone Call Additional follow up Details #1::        Medication phoned to pharmacy.  Appointment scheduled   Additional Follow-up by: Delilah Shan CMA Krisanne Lich Dull),  February 27, 2010 11:56 AM    Prescriptions: VICODIN ES 7.5-750 MG TABS (HYDROCODONE-ACETAMINOPHEN) Take 1-2 tablet by mouth three times a day sparingly as needed for pain Max 5 tablets daily- talk to Dr. Para March before rxing  #150 x 0   Entered by:   Delilah Shan CMA (AAMA)   Authorized by:   Crawford Givens MD   Signed by:   Delilah Shan CMA (AAMA) on 02/27/2010   Method used:   Handwritten   RxID:   [id number]

## 2010-03-06 NOTE — Progress Notes (Signed)
Summary: Rx Hydrocodone  Phone Note Refill Request Call back at [phone] Message from:  [facility] on January 28, 2010 2:09 PM  Refills Requested: Medication #1:  VICODIN ES 7.5-750 MG TABS Take 1-2 tablet by mouth three times a day sparingly as needed for pain Max 5 tablets daily- talk to Dr. Para March before rxing   Last Refilled: 12/31/2009 Received faxed refill request please advise.   Method Requested: Telephone to Pharmacy Initial call taken by: Linde Gillis CMA Duncan Dull),  January 28, 2010 2:09 PM  Follow-up for Phone Call        please call in VICODIN 7.5-750 MG TABS Take 1-2 tablet by mouth three times a day sparingly as needed for pain, max 5 per day, #150 per 30 days, no rf.  thanks.   please update med list.   Follow-up by: Crawford Givens MD,  January 29, 2010 12:45 PM  Additional Follow-up for Phone Call Additional follow up Details #1::        Rx called to pharmacy Additional Follow-up by: Linde Gillis CMA Duncan Dull),  January 29, 2010 4:11 PM    Prescriptions: VICODIN ES 7.5-750 MG TABS (HYDROCODONE-ACETAMINOPHEN) Take 1-2 tablet by mouth three times a day sparingly as needed for pain Max 5 tablets daily- talk to Dr. Para March before rxing  #150 x 0   Entered by:   Linde Gillis CMA (AAMA)   Authorized by:   Crawford Givens MD   Signed by:   Linde Gillis CMA (AAMA) on 01/29/2010   Method used:   Telephoned to ...       [facility]* (retail)       [address]       [place], [place]  [address]       Ph: [phone]       Fax: [phone]   RxID:   [id number]

## 2010-03-06 NOTE — Progress Notes (Signed)
Summary: Medication   Phone Note Call from Patient Call back at Home Phone [phone]   Call For: Dr.Odessie Polzin Reason for Call: Talk to Nurse Summary of Call: Pt has questions about her medication  Initial call taken by: [place] Johnson,  February 10, 2010 9:59 AM  Follow-up for Phone Call        Left message to call back Follow-up by: Selinda Michaels RN,  February 10, 2010 11:50 AM  Additional Follow-up for Phone Call Additional follow up Details #1::        Spoke with patient and she states that she came in the office yesterday and got things taken care of, states she was given some samples. Additional Follow-up by: Selinda Michaels RN,  February 11, 2010 2:34 PM

## 2010-03-10 ENCOUNTER — Telehealth: Payer: Self-pay | Admitting: Family Medicine

## 2010-03-12 NOTE — Progress Notes (Signed)
Summary: triage   Phone Note Call from Patient Call back at Home Phone [phone]   Caller: Patient Call For: Dr. Arlyce Dice Reason for Call: Talk to Nurse Summary of Call: Pt needs to speak with someone about her medication Initial call taken by: [place] Johnson,  March 03, 2010 11:19 AM  Follow-up for Phone Call        Spoke with patient and let her know the Aciphex has not come in yet. Follow-up by: Selinda Michaels RN,  March 03, 2010 1:57 PM

## 2010-03-14 ENCOUNTER — Telehealth: Payer: Self-pay | Admitting: Gastroenterology

## 2010-03-20 NOTE — Progress Notes (Signed)
Summary: Questions   Phone Note Call from Patient Call back at Home Phone [phone]   Caller: Patient Call For: dr. Arlyce Dice Reason for Call: Talk to Nurse Summary of Call: Pt is calling to see if we have recieved her Aciphex? Initial call taken by: [place] Johnson,  March 14, 2010 10:13 AM  Follow-up for Phone Call        Spoke with patient and let her know we have not received her medication yet. Suggested patient call the company to see wht the hold up on her medication might be. Follow-up by: Selinda Michaels RN,  March 14, 2010 11:52 AM

## 2010-03-20 NOTE — Progress Notes (Signed)
Summary: lice   Phone Note Call from Patient Call back at Home Phone [phone]   Caller: Patient Call For: Cristina Givens MD Summary of Call: Patient says that she and her family have been struggling with lice over the past couple of months, she has tried using the OTC products and nothing is getting rid of them. She is asking if she could get a rx sent to [facility].  Initial call taken by: Melody Comas,  March 10, 2010 3:05 PM  Follow-up for Phone Call        what has she used?  recommend nix or permethrin cream 1%.  if already used that, will want office visit to discuss.  permethrin cream 1% is ok to use on children if >14mo. Follow-up by: Eustaquio Boyden  MD,  March 10, 2010 4:47 PM  Additional Follow-up for Phone Call Additional follow up Details #1::        Spoke with patient and she has already used RID and NIX otc. She was told by someone to [employer] detergent and vinegar. She is trying that now. She is hesitant to come in because she has no insurance. She said she would come in if the home remedy doesn't work. All the kids are 10 and older. She said she is going crazy trying to get things under control. I did advise she may need to call [employer] or another pest control place to help with the house if things are that out of control.  Additional Follow-up by: Janee Morn CMA Duncan Dull),  March 11, 2010 8:08 AM    Additional Follow-up for Phone Call Additional follow up Details #2::    Advised pt that medicine has been called in.              [name] CMA, AAMA  March 11, 2010 8:14 AM  head lice?  May try Malathione.  inform to use as directed.  nits (eggs) may stay in hair even though lice are dead, the eggs take time to get out, discuss importance of fine combing to remove.  vinegar can help remove dead eggs. Eustaquio Boyden  MD  March 11, 2010 8:19 AM   Message left notifying patient of new Rx and to make sure she is using a fine tooth comb to  remove dead eggs. Instructed her to call with questions or if things don't get under control with new med.  Follow-up by: Janee Morn CMA (AAMA),  March 11, 2010 8:35 AM  New/Updated Medications: NIX COMPLETE LICE SYSTEM 1 % LIQD (PERMETHRIN) use as directed MALATHION 0.5 % LOTN (MALATHION) apply as directed Prescriptions: MALATHION 0.5 % LOTN (MALATHION) apply as directed  #1 x 0   Entered and Authorized by:   Eustaquio Boyden  MD   Signed by:   Eustaquio Boyden  MD on 03/11/2010   Method used:   Electronically to        [facility]* (retail)       [address]       [place], [place]  [address]       Ph: [phone]       Fax: [phone]   RxID:   [id number] NIX COMPLETE LICE SYSTEM 1 % LIQD (PERMETHRIN) use as directed  #1 x 0   Entered and Authorized by:   Eustaquio Boyden  MD   Signed by:   Eustaquio Boyden  MD on 03/10/2010   Method used:   Electronically to  [facility]* (retail)       [address]       [place], [place]  [address]       Ph: [phone]       Fax: [phone]   RxID:   [id number]

## 2010-03-26 ENCOUNTER — Telehealth: Payer: Self-pay | Admitting: Gastroenterology

## 2010-03-27 ENCOUNTER — Ambulatory Visit (INDEPENDENT_AMBULATORY_CARE_PROVIDER_SITE_OTHER): Payer: Self-pay | Admitting: Family Medicine

## 2010-03-27 ENCOUNTER — Encounter: Payer: Self-pay | Admitting: Family Medicine

## 2010-03-27 DIAGNOSIS — B9789 Other viral agents as the cause of diseases classified elsewhere: Secondary | ICD-10-CM

## 2010-03-27 DIAGNOSIS — M549 Dorsalgia, unspecified: Secondary | ICD-10-CM

## 2010-04-01 NOTE — Progress Notes (Signed)
Summary: Samples   Phone Note Call from Patient Call back at Home Phone [phone]   Caller: Patient Call For: Dr. Arlyce Dice Reason for Call: Talk to Nurse Summary of Call: Asking for samples of Aciphex and wants to discuss the meds. Initial call taken by: Karna Christmas,  March 26, 2010 11:49 AM  Follow-up for Phone Call        Pts meds came in today from PAP program. Called pt to inform and put medications in the front for pt to pick up Follow-up by: Merri Ray CMA Duncan Dull),  March 27, 2010 10:49 AM
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
## 2010-04-16 LAB — URINALYSIS, ROUTINE W REFLEX MICROSCOPIC
Glucose, UA: NEGATIVE mg/dL
Specific Gravity, Urine: <1.005 — ABNORMAL LOW (ref 1.005–1.030)

## 2010-04-16 LAB — COMPREHENSIVE METABOLIC PANEL
AST: 94 U/L — ABNORMAL HIGH (ref 0–37)
Albumin: 4.7 g/dL (ref 3.5–5.2)
Alkaline Phosphatase: 101 U/L (ref 39–117)
BUN: 15 mg/dL (ref 6–23)
CO2: 28 mEq/L (ref 19–32)
Chloride: 104 mEq/L (ref 96–112)
Potassium: 3.8 mEq/L (ref 3.5–5.1)
Total Bilirubin: 0.4 mg/dL (ref 0.3–1.2)

## 2010-04-16 LAB — PROTIME-INR: INR: 0.97 (ref 0.00–1.49)

## 2010-04-16 LAB — CBC
HCT: 38.7 % (ref 36.0–46.0)
Hemoglobin: 13.4 g/dL (ref 12.0–15.0)
MCH: 34.6 pg — ABNORMAL HIGH (ref 26.0–34.0)
MCHC: 34.6 g/dL (ref 30.0–36.0)

## 2010-04-16 LAB — POCT I-STAT, CHEM 8
Chloride: 106 mEq/L (ref 96–112)
Creatinine, Ser: 1.3 mg/dL — ABNORMAL HIGH (ref 0.4–1.2)
Glucose, Bld: 106 mg/dL — ABNORMAL HIGH (ref 70–99)
Potassium: 4 mEq/L (ref 3.5–5.1)

## 2010-04-16 LAB — URINE MICROSCOPIC-ADD ON

## 2010-04-16 LAB — DIFFERENTIAL
Basophils Relative: 0 % (ref 0–1)
Monocytes Absolute: 0.6 10*3/uL (ref 0.1–1.0)
Monocytes Relative: 10 % (ref 3–12)
Neutro Abs: 3.2 10*3/uL (ref 1.7–7.7)

## 2010-04-16 LAB — RAPID URINE DRUG SCREEN, HOSP PERFORMED: Barbiturates: NOT DETECTED

## 2010-04-19 ENCOUNTER — Encounter: Payer: Self-pay | Admitting: Family Medicine

## 2010-04-28 ENCOUNTER — Telehealth: Payer: Self-pay | Admitting: *Deleted

## 2010-04-28 NOTE — Telephone Encounter (Signed)
Called pt to inform Aciphex was here from PAP

## 2010-05-23 ENCOUNTER — Other Ambulatory Visit: Payer: Self-pay | Admitting: *Deleted

## 2010-05-23 MED ORDER — ALBUTEROL SULFATE HFA 108 (90 BASE) MCG/ACT IN AERS: INHALATION_SPRAY | RESPIRATORY_TRACT | Status: DC

## 2010-05-23 MED ORDER — HYDROCODONE-ACETAMINOPHEN 7.5-750 MG PO TABS: ORAL_TABLET | ORAL | Status: DC

## 2010-05-23 NOTE — Telephone Encounter (Signed)
Medication phoned to pharmacy.  

## 2010-05-23 NOTE — Telephone Encounter (Signed)
Please call in

## 2010-05-28 ENCOUNTER — Telehealth: Payer: Self-pay | Admitting: *Deleted

## 2010-05-28 NOTE — Telephone Encounter (Signed)
CALLED PT TO INFORM MEDICATION FROM PAP IS IN

## 2010-05-30 ENCOUNTER — Other Ambulatory Visit: Payer: Self-pay | Admitting: Family Medicine

## 2010-06-05 ENCOUNTER — Ambulatory Visit: Payer: Self-pay | Admitting: Family Medicine

## 2010-06-10 ENCOUNTER — Encounter: Payer: Self-pay | Admitting: Family Medicine

## 2010-06-10 ENCOUNTER — Ambulatory Visit (INDEPENDENT_AMBULATORY_CARE_PROVIDER_SITE_OTHER): Payer: Self-pay | Admitting: Family Medicine

## 2010-06-10 VITALS — BP 142/100 | HR 72 | Temp 97.6°F | Wt 137.1 lb

## 2010-06-10 DIAGNOSIS — N63 Unspecified lump in unspecified breast: Secondary | ICD-10-CM | POA: Insufficient documentation

## 2010-06-10 NOTE — Assessment & Plan Note (Addendum)
Refer for mammogram.  She agrees.

## 2010-06-10 NOTE — Progress Notes (Signed)
Breast mass, L sided, inferior and medial.  Noted about 3-4 months ago.  Not ttp. No skin changes.  No nipple discharge.  No change in the size of the lesion.  No h/o breast mass, CA, abnormal mammogram.  No FCNAVD.  No LA known by patient.    Aunt with breast cancer.  GM had breast cancer.  She was due for mammogram.    Meds, vitals, and allergies reviewed.   ROS: See HPI.  Otherwise, noncontributory.  nad Breast exam: No mass, nodules, thickening, tenderness, bulging, retraction, inflamation, nipple discharge or skin changes noted except for a 1-2 cm nontender mass on the inferior/medial side of L breast w/o overlying skin changes.  No axillary or clavicular LA.  Chaperoned exam.

## 2010-06-10 NOTE — Patient Instructions (Signed)
See [place] about your referral before your leave today.  

## 2010-06-16 ENCOUNTER — Ambulatory Visit
Admission: RE | Admit: 2010-06-16 | Discharge: 2010-06-16 | Disposition: A | Discharge status: Home / Self Care | Payer: Self-pay | Source: Ambulatory Visit | Attending: Family Medicine | Admitting: Family Medicine

## 2010-06-16 ENCOUNTER — Other Ambulatory Visit: Payer: Self-pay | Admitting: Family Medicine

## 2010-06-16 DIAGNOSIS — N63 Unspecified lump in unspecified breast: Secondary | ICD-10-CM

## 2010-06-17 NOTE — Assessment & Plan Note (Signed)
[facility]                         GASTROENTEROLOGY OFFICE NOTE   Cristina, Myers                       MRN:          [id number]  DATE:09/01/2006                            DOB:          02-Feb-1961    REFERRING PHYSICIAN:  Arta Silence, MD   PROBLEM:  Barrett's esophagus.   HISTORY OF PRESENT ILLNESS:  Cristina Myers is a pleasant, 50 year old,  white female referred through the courtesy of Dr. Hetty Ely for  surveillance endoscopy. She has a history of Barrett's esophagus and was  last examined in 2005. She has frequent pyrosis for which she takes  AcipHex. When she was switched to omeprazole and Nexium she had frequent  breakthrough symptoms. She denies dysphagia. Without medicines she  complains of abdominal bloating as well. Constipation has been a chronic  problem. There is no history of melena or hematochezia. Family history  pertinent for her mother with colon polyps. Two siblings had colon  cancer.   PAST MEDICAL HISTORY:  Pertinent for arthritis. She has claustrophobia.   FAMILY HISTORY:  As stated above.   MEDICATIONS:  Morphine, hydrocodone and Prilosec.   ALLERGIES:  She has no allergies.   SOCIAL HISTORY:  She stopped smoking after 37 years. She rarely has a  drink. She is married and is currently unemployed.   REVIEW OF SYSTEMS:  Positive for joint pain and back pain.   PHYSICAL EXAMINATION:  VITAL SIGNS:  Pulse 72, blood pressure 112/76,  weight 141.  HEENT:  EOMI. PERRLA. Sclerae are anicteric.  Conjunctivae are pink.  NECK:  Supple without thyromegaly, adenopathy or carotid bruits.  CHEST:  Clear to auscultation and percussion without adventitious  sounds.  CARDIAC:  Regular rhythm; normal S1 S2.  There are no murmurs, gallops  or rubs.  ABDOMEN:  Bowel sounds are normoactive.  Abdomen is soft, non-tender and  non-distended.  There are no abdominal masses, tenderness, splenic  enlargement or hepatomegaly.  EXTREMITIES:  Full range of motion.  No cyanosis, clubbing or edema.  RECTAL:  Deferred.   IMPRESSION:  1. History of Barrett's esophagus.  2. Family history of colorectal cancer.  3. Gastroesophageal reflux disease.   RECOMMENDATION:  1. Resume AcipHex 20 mg a day.  2. Surveillance upper endoscopy for her Barrett's.  3. Screening colonoscopy.     Barbette Hair. Arlyce Dice, MD,FACG  Electronically Signed    RDK/MedQ  DD: 09/01/2006  DT: 09/02/2006  Job #: [id number]   cc:   Arta Silence, MD

## 2010-06-20 NOTE — Assessment & Plan Note (Signed)
[facility]                                 ON-CALL NOTE   Cristina Myers, Cristina Myers                       MRN:          [id number]  DATE:01/12/2006                            DOB:          July 23, 1960    The husband, Cristina Myers, called in [phone], at 5:01 p.m. on January 12, 2006, saying he called this afternoon to get some medicines refilled  for his wife.  Patient was told yesterday that Flexeril, Vicodin, and  Xanax would be called in to the pharmacy.  As of this morning, it had  not been called in.  The patient's husband called this morning and was  told that it would be called in by 2:00 p.m. today, and at 5:00 o'clock  this afternoon it had not been called in yet.  I explained that normally  we could not call in those types of medications, however, under the  circumstances, I called in 4 Vicodin tablets and 3 Flexeril tablets via  the E-Prescribe, to the patient's pharmacy until she could get in touch  with Dr. Lorenza Chick office in the morning to get the full refills.     Lelon Perla, DO  Electronically Signed    Shawnie Dapper  DD: 01/12/2006  DT: 01/13/2006  Job #: [id number]   cc:   Arta Silence, MD

## 2010-06-25 ENCOUNTER — Encounter: Payer: Self-pay | Admitting: *Deleted

## 2010-07-15 ENCOUNTER — Other Ambulatory Visit: Payer: Self-pay | Admitting: *Deleted

## 2010-07-16 ENCOUNTER — Other Ambulatory Visit: Payer: Self-pay | Admitting: *Deleted

## 2010-07-16 NOTE — Telephone Encounter (Signed)
Opened in error

## 2010-07-17 MED ORDER — HYDROCODONE-ACETAMINOPHEN 7.5-750 MG PO TABS: ORAL_TABLET | ORAL | Status: DC

## 2010-07-17 NOTE — Telephone Encounter (Signed)
Called to [facility]. 

## 2010-07-17 NOTE — Telephone Encounter (Signed)
Please call in

## 2010-07-24 ENCOUNTER — Encounter: Payer: Self-pay | Admitting: Family Medicine

## 2010-07-24 ENCOUNTER — Ambulatory Visit (INDEPENDENT_AMBULATORY_CARE_PROVIDER_SITE_OTHER): Payer: Self-pay | Admitting: Family Medicine

## 2010-07-24 DIAGNOSIS — M549 Dorsalgia, unspecified: Secondary | ICD-10-CM

## 2010-07-24 MED ORDER — HYDROCODONE-ACETAMINOPHEN 10-325 MG PO TABS: 1.0000 | ORAL_TABLET | Freq: Four times a day (QID) | ORAL | Status: DC | PRN

## 2010-07-24 NOTE — Patient Instructions (Signed)
Use the vicodin that your currently have.  You can take up to 5 pills a day of your current rx.  Try taking 1.5 tabs at night for the pain. When you run out, you can fill the new rx.  Note the dose change.  You can take 4 pills a day of the new rx.  This is actually more medicine.  Let me know how that works for the pain about 1-2 weeks after the change.  Take care.

## 2010-07-24 NOTE — Progress Notes (Signed)
Back pain.  "I'm always in pain".  She's trying to put off surgery as long as possible.  Sleep is worse now.  Had been on #150/month of the 7.5/750 vicodin since 2009.  B paraspinal pain the mid back. Pain lifting grandchild.  Occ radiation into the legs, but this is rare.  Longstanding degenerative changes.  Using heat, stretching, with some relief.  She's asking about options.   Total of 37.5mg  hydrocodone a day currently.    Meds, vitals, and allergies reviewed.   ROS: See HPI.  Otherwise, noncontributory.  nad but uncomfortable Mmm rrr ctab Back w/o midline pain but B paraspinal muscle tenderness in the midback.  No pain with facet loading.  No overlying skin changes. Distally nv intact.

## 2010-07-25 ENCOUNTER — Telehealth: Payer: Self-pay | Admitting: *Deleted

## 2010-07-25 NOTE — Assessment & Plan Note (Signed)
Will slowly inc the total hydrocodone dose.  See rx.  D/w pt and she understood.  Hope to delay surgery as much as possible.  No emergent findings on exam today. GI caution given.  No illicit use per patient. She'll call back with update.

## 2010-07-25 NOTE — Telephone Encounter (Signed)
Called to inform pt her Aciphex is in from PAP. Phone line not accepting any calls, could not leave any message.

## 2010-08-25 ENCOUNTER — Telehealth: Payer: Self-pay | Admitting: *Deleted

## 2010-08-25 NOTE — Telephone Encounter (Signed)
Pt called just to let you know that the new vicodine dose is working well.  She doesn't need a call back.

## 2010-08-25 NOTE — Telephone Encounter (Signed)
Noted  

## 2010-08-27 ENCOUNTER — Telehealth: Payer: Self-pay | Admitting: *Deleted

## 2010-08-27 NOTE — Telephone Encounter (Signed)
Cant get in touch with patient number is unavailable

## 2010-09-10 ENCOUNTER — Telehealth: Payer: Self-pay | Admitting: *Deleted

## 2010-09-10 MED ORDER — HYDROCODONE-ACETAMINOPHEN 10-325 MG PO TABS: 1.0000 | ORAL_TABLET | Freq: Four times a day (QID) | ORAL | Status: DC | PRN

## 2010-09-10 NOTE — Telephone Encounter (Signed)
Please give to pt.  This was rx's prev.

## 2010-09-10 NOTE — Telephone Encounter (Signed)
Ms. Roskelley is asking if she can get a 90 day prescription for her Hydrocodone 10-325.  She takes one tablet four times a day.  She needs this because she has a temporary job in [place] for 3 month intervals and does not want to have to come back here each month for the written prescription.  I could not find this on her medication list as having been prescribed previously.

## 2010-09-11 NOTE — Telephone Encounter (Signed)
Patient advised, Rx ready for pick up will be left at front desk. 

## 2010-10-22 ENCOUNTER — Telehealth: Payer: Self-pay | Admitting: Gastroenterology

## 2010-10-22 NOTE — Telephone Encounter (Signed)
Called pt, informed her her medication has been here since July. We was not able to get in touch with her. She stated her phones have been turned off and she just got them turned back on. Will pick up Aciphex on Monday

## 2010-11-24 ENCOUNTER — Telehealth: Payer: Self-pay | Admitting: *Deleted

## 2010-11-24 NOTE — Telephone Encounter (Signed)
Pt is asking for a muscle relaxer and vicodin to be called to [facility] in [place], [place], phone [phone].  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  Please let her know if called in.

## 2010-11-24 NOTE — Telephone Encounter (Signed)
She'll need to be seen up there.  She had a 90 day rx printed on 09/10/10.  This is too early to refill.

## 2010-11-25 NOTE — Telephone Encounter (Signed)
Patient advised.

## 2010-11-27 ENCOUNTER — Telehealth: Payer: Self-pay | Admitting: Family Medicine

## 2010-11-27 MED ORDER — CYCLOBENZAPRINE HCL 10 MG PO TABS: 5.0000 mg | ORAL_TABLET | Freq: Three times a day (TID) | ORAL | Status: DC | PRN

## 2010-11-27 NOTE — Telephone Encounter (Signed)
Addendum to prev- I called pt.  She hadn't run out of vicodin.  I told her this couldn't be filled early.  She understood.  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  We talked about options.  It would be reasonable to use flexeril with sedation caution for prn muscle pain.  She agreed.  She had taken this prev.  I'll ask that this be called in to [facility] in [place], [place], phone [phone].

## 2010-11-27 NOTE — Telephone Encounter (Signed)
Medication phoned to pharmacy.  

## 2010-11-27 NOTE — Telephone Encounter (Signed)
She's having pain but hasn't run out of vicodin.   I'll call in some flexeril.

## 2010-12-08 ENCOUNTER — Other Ambulatory Visit: Payer: Self-pay | Admitting: *Deleted

## 2010-12-08 MED ORDER — HYDROCODONE-ACETAMINOPHEN 10-325 MG PO TABS: 1.0000 | ORAL_TABLET | Freq: Four times a day (QID) | ORAL | Status: DC | PRN

## 2010-12-08 NOTE — Telephone Encounter (Signed)
Pt is asking that refill on vicodin be called to [facility] in [place], [place]- phone [phone].

## 2010-12-08 NOTE — Telephone Encounter (Signed)
Please call in.  Please see when she'll be back in [place] for recheck.  Thanks.

## 2010-12-08 NOTE — Telephone Encounter (Signed)
Please try her again.  If she can't be reached, then send notice through her husband.  Thanks.

## 2010-12-08 NOTE — Telephone Encounter (Signed)
Medication phoned to pharmacy.  Patient could not be reached to get info requested.  VM says she is not accepting calls at this time.

## 2010-12-09 NOTE — Telephone Encounter (Signed)
Husband says her job in [place] is supposed to last until February but that she will likely be home for the holidays.  I asked him to please have her to make an appt with your during the time that she is here and to try to schedule that as soon as she knows when that will be.

## 2010-12-09 NOTE — Telephone Encounter (Signed)
Noted  

## 2011-01-02 ENCOUNTER — Other Ambulatory Visit: Payer: Self-pay | Admitting: *Deleted

## 2011-01-06 ENCOUNTER — Other Ambulatory Visit: Payer: Self-pay | Admitting: Internal Medicine

## 2011-01-06 MED ORDER — HYDROCODONE-ACETAMINOPHEN 10-325 MG PO TABS: 1.0000 | ORAL_TABLET | Freq: Four times a day (QID) | ORAL | Status: DC | PRN

## 2011-01-06 NOTE — Telephone Encounter (Signed)
Please call in

## 2011-01-06 NOTE — Telephone Encounter (Signed)
Please call this in, if not already done.

## 2011-01-07 NOTE — Telephone Encounter (Signed)
Rx called in as directed.   

## 2011-02-05 ENCOUNTER — Other Ambulatory Visit: Payer: Self-pay | Admitting: *Deleted

## 2011-02-05 NOTE — Telephone Encounter (Signed)
Received faxed refill request from pharmacy. Is it okay to refill medication? 

## 2011-02-06 ENCOUNTER — Telehealth: Payer: Self-pay | Admitting: Family Medicine

## 2011-02-06 MED ORDER — HYDROCODONE-ACETAMINOPHEN 10-325 MG PO TABS: 1.0000 | ORAL_TABLET | Freq: Four times a day (QID) | ORAL | Status: DC | PRN

## 2011-02-06 NOTE — Telephone Encounter (Signed)
Please call in.  She needs to be seen in next month.  OV.  Thanks.

## 2011-02-06 NOTE — Telephone Encounter (Signed)
Medication phoned to pharmacy.  LMOVM to schedule 30 min appt.

## 2011-02-06 NOTE — Telephone Encounter (Signed)
Needs Vicodin called in for refill. Pharmacy # [phone].  Patient can be called back at [phone]

## 2011-02-06 NOTE — Telephone Encounter (Signed)
Medication phoned to pharmacy.  

## 2011-02-11 ENCOUNTER — Other Ambulatory Visit: Payer: Self-pay | Admitting: Gastroenterology

## 2011-02-13 NOTE — Telephone Encounter (Signed)
Patient to pick up samples of Aciphex today  Waiting to renew PAP

## 2011-03-04 ENCOUNTER — Ambulatory Visit: Payer: Self-pay | Admitting: Family Medicine

## 2011-03-06 ENCOUNTER — Telehealth: Payer: Self-pay | Admitting: Family Medicine

## 2011-03-06 ENCOUNTER — Other Ambulatory Visit: Payer: Self-pay | Admitting: *Deleted

## 2011-03-06 NOTE — Telephone Encounter (Signed)
RF requests were faxed.

## 2011-03-06 NOTE — Telephone Encounter (Signed)
This patient had scheduled an appt with you on 03/04/11 but canceled.

## 2011-03-08 MED ORDER — HYDROCODONE-ACETAMINOPHEN 10-325 MG PO TABS: 1.0000 | ORAL_TABLET | Freq: Four times a day (QID) | ORAL | Status: DC | PRN

## 2011-03-08 MED ORDER — CYCLOBENZAPRINE HCL 10 MG PO TABS: 5.0000 mg | ORAL_TABLET | Freq: Three times a day (TID) | ORAL | Status: DC | PRN

## 2011-03-08 NOTE — Telephone Encounter (Signed)
No more refills beyond this unless she makes/keeps OV in next 30 days.  She needs to be seen.  Please call in.

## 2011-03-09 ENCOUNTER — Other Ambulatory Visit: Payer: Self-pay | Admitting: *Deleted

## 2011-03-09 NOTE — Telephone Encounter (Signed)
Medication phoned to pharmacy.  Patient showed up in the lobby and was advised that the refill had been phoned in and that there would be no more refills without a 30 min OV.

## 2011-03-12 ENCOUNTER — Ambulatory Visit (INDEPENDENT_AMBULATORY_CARE_PROVIDER_SITE_OTHER): Payer: Self-pay | Admitting: Family Medicine

## 2011-03-12 ENCOUNTER — Encounter: Payer: Self-pay | Admitting: Family Medicine

## 2011-03-12 DIAGNOSIS — M549 Dorsalgia, unspecified: Secondary | ICD-10-CM

## 2011-03-12 DIAGNOSIS — F17200 Nicotine dependence, unspecified, uncomplicated: Secondary | ICD-10-CM

## 2011-03-12 NOTE — Progress Notes (Signed)
Back and neck pain.  Now back to [employer].  She transferred back down from [place].  She's standing a lot more and this is causing her back to hurt more.  Bilateral paraspinal in mid/lower T spine.  She's working on getting new shoes.  Husband is massaging her back and that helps some.  She feels good "except for hurting".  Her current meds help.  No ADE.  Moving bowels.  She's able to get by with current meds. No sedation from meds.  No leg weakness, no bowel/bladder sx.    No etoh.  No illicits.  1 PPD.  "I enjoy smoking cigarettes."  She took chantix prev.    Still seeing psych about ADD meds.  She's doing well with this.    Had neg w/u for breast mass last year.    Meds, vitals, and allergies reviewed.   ROS: See HPI.  Otherwise, noncontributory.  nad Ncat Mmm rrr ctab Neck supple Back with B paraspinal tightness throughout the T spine  ext well perfused Strength and DTRs wnl x4

## 2011-03-12 NOTE — Patient Instructions (Signed)
Take care.  Let the pharmacy know when you get to your last week of medicine.  Come back and see me in the summer.

## 2011-03-13 ENCOUNTER — Encounter: Payer: Self-pay | Admitting: Family Medicine

## 2011-03-13 NOTE — Assessment & Plan Note (Signed)
Prev with imaging documented pathology.  Continue with current meds.  No ADE, denies illicits.  Okay for outpatient f/u.  She agrees with plan.  >25 min spent with face to face with patient, >50% counseling and/or coordinating care

## 2011-03-13 NOTE — Assessment & Plan Note (Signed)
D/w pt about cessation, she'll consider.

## 2011-03-23 ENCOUNTER — Ambulatory Visit (INDEPENDENT_AMBULATORY_CARE_PROVIDER_SITE_OTHER): Payer: Self-pay | Admitting: Family Medicine

## 2011-03-23 ENCOUNTER — Encounter: Payer: Self-pay | Admitting: Family Medicine

## 2011-03-23 DIAGNOSIS — J441 Chronic obstructive pulmonary disease with (acute) exacerbation: Secondary | ICD-10-CM

## 2011-03-23 DIAGNOSIS — J209 Acute bronchitis, unspecified: Secondary | ICD-10-CM

## 2011-03-23 MED ORDER — DOXYCYCLINE HYCLATE 100 MG PO TABS: 100.0000 mg | ORAL_TABLET | Freq: Two times a day (BID) | ORAL | Status: AC

## 2011-03-23 MED ORDER — PREDNISONE 20 MG PO TABS: 40.0000 mg | ORAL_TABLET | Freq: Every day | ORAL | Status: AC

## 2011-03-23 NOTE — Progress Notes (Signed)
  Patient Name: Cristina Myers Date of Birth: 1960-10-01 Age: 51 y.o. Medical Record Number: [id number] Gender: female Date of Encounter: 03/23/2011  History of Present Illness:  Cristina Myers is a 51 y.o. very pleasant female patient who presents with the following:  Smoked for a long time, but and feels like could not get her breath. Inhaler helped a good bit, and made feel better with hyperventilation. Smoking 1 PPD now.  Felt pretty hot during the week. Has been sick for about a week.   The patient spell short of breath, she is also been having some significant wheezing. She continues to smoke. No fever. Eating and drinking normally.  Past Medical History, Surgical History, Social History, Family History, Problem List, Medications, and Allergies have been reviewed and updated if relevant.  Review of Systems: ROS: GEN: Acute illness details above GI: Tolerating PO intake GU: maintaining adequate hydration and urination Pulm: No SOB Interactive and getting along well at home.  Otherwise, ROS is as per the HPI.   Physical Examination: Filed Vitals:   03/23/11 1239  BP: 130/70  Pulse: 88  Temp: 98.1 F (36.7 C)  TempSrc: Oral  Height: 5' 2.5" (1.588 m)  Weight: 134 lb 6.4 oz (60.963 kg)  SpO2: 97%    Body mass index is 24.19 kg/(m^2).   GEN: A and O x 3. WDWN. NAD.    ENT: Nose clear, ext NML.  No LAD.  No JVD.  TM's clear. Oropharynx clear.  PULM: Normal WOB, no distress. No crackles. Mildly decreased breath sounds diffusely as well as occasional wheeze  CV: RRR, no M/G/R, No rubs, No JVD.   EXT: warm and well-perfused, No c/c/e. PSYCH: Pleasant and conversant.   Assessment and Plan: 1. COPD exacerbation  predniSONE (DELTASONE) 20 MG tablet, doxycycline (VIBRA-TABS) 100 MG tablet  2. Bronchitis, acute  predniSONE (DELTASONE) 20 MG tablet, doxycycline (VIBRA-TABS) 100 MG tablet

## 2011-04-01 ENCOUNTER — Other Ambulatory Visit: Payer: Self-pay | Admitting: *Deleted

## 2011-04-01 MED ORDER — HYDROCODONE-ACETAMINOPHEN 10-325 MG PO TABS: 1.0000 | ORAL_TABLET | Freq: Four times a day (QID) | ORAL | Status: DC | PRN

## 2011-04-01 NOTE — Telephone Encounter (Signed)
Please call in

## 2011-04-02 NOTE — Telephone Encounter (Signed)
Medication phoned to pharmacy.  

## 2011-05-04 ENCOUNTER — Other Ambulatory Visit: Payer: Self-pay | Admitting: *Deleted

## 2011-05-04 MED ORDER — HYDROCODONE-ACETAMINOPHEN 10-325 MG PO TABS: 1.0000 | ORAL_TABLET | Freq: Four times a day (QID) | ORAL | Status: DC | PRN

## 2011-05-04 NOTE — Telephone Encounter (Signed)
Please call in

## 2011-05-04 NOTE — Telephone Encounter (Signed)
Received faxed refill request from pharmacy for Hydrocodone/APAP. Is it okay to refill medication? 

## 2011-05-04 NOTE — Telephone Encounter (Signed)
Medication phoned to pharmacy.  

## 2011-05-29 ENCOUNTER — Other Ambulatory Visit: Payer: Self-pay | Admitting: *Deleted

## 2011-05-29 NOTE — Telephone Encounter (Signed)
Faxed refill request   

## 2011-05-31 MED ORDER — HYDROCODONE-ACETAMINOPHEN 10-325 MG PO TABS: 1.0000 | ORAL_TABLET | Freq: Four times a day (QID) | ORAL | Status: DC | PRN

## 2011-05-31 NOTE — Telephone Encounter (Signed)
Please call in

## 2011-06-01 NOTE — Telephone Encounter (Signed)
Medication phoned to pharmacy.  

## 2011-06-26 ENCOUNTER — Other Ambulatory Visit: Payer: Self-pay | Admitting: *Deleted

## 2011-06-26 MED ORDER — HYDROCODONE-ACETAMINOPHEN 10-325 MG PO TABS: 1.0000 | ORAL_TABLET | Freq: Four times a day (QID) | ORAL | Status: DC | PRN

## 2011-06-26 NOTE — Telephone Encounter (Signed)
Please call in on next business day.  It would be early today.

## 2011-06-26 NOTE — Telephone Encounter (Signed)
Faxed request from [facility], last filled 06/01/11 for # [phone].

## 2011-06-30 NOTE — Telephone Encounter (Signed)
Medication phoned to pharmacy.  

## 2011-07-27 ENCOUNTER — Other Ambulatory Visit: Payer: Self-pay

## 2011-07-27 MED ORDER — HYDROCODONE-ACETAMINOPHEN 10-325 MG PO TABS: 1.0000 | ORAL_TABLET | Freq: Four times a day (QID) | ORAL | Status: DC | PRN

## 2011-07-27 NOTE — Telephone Encounter (Signed)
Please call in

## 2011-07-27 NOTE — Telephone Encounter (Signed)
[facility] faxed request hydrocodone-apap. Last refilled 06/30/11.Please advise.

## 2011-07-28 NOTE — Telephone Encounter (Signed)
Medication phoned to [facility] pharmacy as instructed. Patient notified as instructed by telephone.   

## 2011-07-31 ENCOUNTER — Telehealth: Payer: Self-pay | Admitting: Family Medicine

## 2011-07-31 NOTE — Telephone Encounter (Signed)
  Caller: Jerry/Spouse; PCP: Crawford Givens Clelia Croft); CB#: [phone]; ; ; Call regarding Vomiting Ate Raw Chicken 07/30/11.  Since 0600 she has vomited several times withsome diarrhea stools.  Triaged Nausea and Vomiting and disp = needs to be seen in the E/R for unbearable abd pain.   He will take her to [facility].

## 2011-07-31 NOTE — Telephone Encounter (Signed)
If she has unbearable abd pain, this is reasonable.

## 2011-08-03 ENCOUNTER — Encounter: Payer: Self-pay | Admitting: *Deleted

## 2011-08-03 ENCOUNTER — Telehealth: Payer: Self-pay | Admitting: Family Medicine

## 2011-08-03 NOTE — Telephone Encounter (Signed)
  SEE HX from 07/31/11. Caller: Jerry/Spouse; PCP: Crawford Givens Clelia Croft); CB#: [phone];  Call regarding Requesting Work Excuse. 07/31/11 Pt was advised to go to E/R after eating raw chicken, but refused to go.  She is  back at work today and husband requesting work excuse  for 07/31/11 and 08/01/11.  If approved fax # is  [phone], or he can pick up from ofc.  Please call husband if this CANNOT be done.  Ty!

## 2011-08-03 NOTE — Telephone Encounter (Signed)
Done and faxed

## 2011-08-03 NOTE — Telephone Encounter (Signed)
Please give her a work excuse from 07/31/11 and 08/01/11.  Dx vomiting, diarrhea.  Thanks.

## 2011-08-21 ENCOUNTER — Other Ambulatory Visit: Payer: Self-pay | Admitting: *Deleted

## 2011-08-21 NOTE — Telephone Encounter (Signed)
Faxed refill request   

## 2011-08-24 MED ORDER — HYDROCODONE-ACETAMINOPHEN 10-325 MG PO TABS: 1.0000 | ORAL_TABLET | Freq: Four times a day (QID) | ORAL | Status: DC | PRN

## 2011-08-24 NOTE — Telephone Encounter (Signed)
Please call in. Needs OV this fall.

## 2011-08-24 NOTE — Telephone Encounter (Signed)
Medication phoned to pharmacy.  

## 2011-09-17 ENCOUNTER — Other Ambulatory Visit: Payer: Self-pay | Admitting: *Deleted

## 2011-09-17 NOTE — Telephone Encounter (Signed)
Faxed refill [facility] from [facility], last filled 08/24/11. 

## 2011-09-18 ENCOUNTER — Encounter: Payer: Self-pay | Admitting: *Deleted

## 2011-09-18 MED ORDER — HYDROCODONE-ACETAMINOPHEN 10-325 MG PO TABS: 1.0000 | ORAL_TABLET | Freq: Four times a day (QID) | ORAL | Status: DC | PRN

## 2011-09-18 NOTE — Telephone Encounter (Signed)
Please call in.  Last rx sent 08/21/11.  Needs OV schedule for this fall.

## 2011-09-18 NOTE — Telephone Encounter (Signed)
Medication phoned to pharmacy.  Patient advised to schedule OV this fall.

## 2011-10-06 ENCOUNTER — Encounter: Payer: Self-pay | Admitting: Gastroenterology

## 2011-10-14 ENCOUNTER — Other Ambulatory Visit: Payer: Self-pay | Admitting: *Deleted

## 2011-10-14 MED ORDER — HYDROCODONE-ACETAMINOPHEN 10-325 MG PO TABS: 1.0000 | ORAL_TABLET | Freq: Four times a day (QID) | ORAL | Status: DC | PRN

## 2011-10-14 MED ORDER — CYCLOBENZAPRINE HCL 10 MG PO TABS: 5.0000 mg | ORAL_TABLET | Freq: Three times a day (TID) | ORAL | Status: DC | PRN

## 2011-10-14 NOTE — Telephone Encounter (Signed)
Left detailed message on cell phone to call and schedule an appointment and once appointment is scheduled medication will be called in.

## 2011-10-14 NOTE — Telephone Encounter (Signed)
Please call these in after she is schedule for an OV.  Don't call in unless she schedules.  She was to schedule for this fall.

## 2011-10-14 NOTE — Telephone Encounter (Signed)
Received faxed refill request from pharmacy for Hydrocodone and Cyclobenzaprine. Directions from the pharmacy for Cyclobenzaprine 10 mg, take one tablet by mouth three times daily as needed. See med sheet. Is it okay to refill medications? Last office visit 03/23/11.

## 2011-10-20 NOTE — Telephone Encounter (Signed)
Patient has not returned the call nor scheduled an appt.

## 2011-10-21 ENCOUNTER — Telehealth: Payer: Self-pay | Admitting: Family Medicine

## 2011-10-21 NOTE — Telephone Encounter (Signed)
Rx called to pharmacy as instructed. 

## 2011-10-21 NOTE — Telephone Encounter (Signed)
Since she is schedule for the OV, please call in the hydrocodone and flexeril.  See prev notes.  Thanks.

## 2011-10-21 NOTE — Telephone Encounter (Signed)
Pt came by to get meds refilled. I saw the phone note on where she needed to make appointment. I scheduled her for next Tuesday at 12:15. She was wondering if the meds could be called in for her.

## 2011-10-22 ENCOUNTER — Other Ambulatory Visit: Payer: Self-pay | Admitting: *Deleted

## 2011-10-22 ENCOUNTER — Telehealth: Payer: Self-pay

## 2011-10-22 NOTE — Telephone Encounter (Signed)
Patient advised that note is ready.  She says she will pick it up at her appt on Tuesday unless her workplace insists that they have it tomorrow.  In that case, she will call to ask that it be faxed.  Note left at front desk for pick up.

## 2011-10-22 NOTE — Telephone Encounter (Signed)
Faxed refill request.  Last filled 10/14/11.

## 2011-10-22 NOTE — Telephone Encounter (Signed)
Printed at done.

## 2011-10-22 NOTE — Telephone Encounter (Signed)
Pt woke this morning with neck pain and numbness in rt arm due to not taking Hydrocodone. Pt picked up med and is much better now. Pt works for [employer] and needs note for work today. Pt already has appt to see Dr Para March 10/27/11. Pt has to work 10/23/11 or she would see Dr Para March tomorrow. Please advise.

## 2011-10-23 NOTE — Telephone Encounter (Signed)
Both should have been called in on the 18th.  See prev notes. Don't fill again now.

## 2011-10-26 ENCOUNTER — Ambulatory Visit: Payer: Self-pay | Admitting: Family Medicine

## 2011-10-27 ENCOUNTER — Ambulatory Visit: Payer: Self-pay | Admitting: Family Medicine

## 2011-10-29 ENCOUNTER — Telehealth: Payer: Self-pay | Admitting: *Deleted

## 2011-10-29 ENCOUNTER — Ambulatory Visit (INDEPENDENT_AMBULATORY_CARE_PROVIDER_SITE_OTHER): Payer: Self-pay | Admitting: Family Medicine

## 2011-10-29 DIAGNOSIS — M549 Dorsalgia, unspecified: Secondary | ICD-10-CM

## 2011-10-29 NOTE — Telephone Encounter (Signed)
Noted  

## 2011-10-29 NOTE — Telephone Encounter (Signed)
Patient checked in at 2:09 PM for a 2:00 PM appt.  The 2:15 PM CPE was here and ready to come back to the room so I proceeded to check him in.  Before I could get her back into the exam room, she was pacing in the lobby and giving Eber Jones a hard time about getting seen so that she could go to work.  I went to get the patient from the lobby at 2:15 PM and she proceeded to say that she was going to have to reschedule because she works a split shift and had to be back at work by 3:00 PM.  I explained that she was 10 minutes late for her appt and if she had been on time, she would have already been seen by the MD.  She admitted that she was late and said she had miscalculated how long it would take to get here.  She says she will call to reschedule.

## 2011-10-30 NOTE — Assessment & Plan Note (Signed)
Pt will need to reschedule. 

## 2011-10-30 NOTE — Progress Notes (Signed)
Pt will need to reschedule. 

## 2011-11-04 ENCOUNTER — Encounter: Payer: Self-pay | Admitting: Family Medicine

## 2011-11-04 ENCOUNTER — Ambulatory Visit (INDEPENDENT_AMBULATORY_CARE_PROVIDER_SITE_OTHER): Payer: Self-pay | Admitting: Family Medicine

## 2011-11-04 VITALS — BP 136/80 | HR 88 | Temp 97.7°F | Wt 130.8 lb

## 2011-11-04 DIAGNOSIS — M549 Dorsalgia, unspecified: Secondary | ICD-10-CM

## 2011-11-04 NOTE — Progress Notes (Signed)
F/u for back pain. Continues to have neck pain and mainly R sided, not midline, back pain in thoracic and lumbar area.  No illicits, not drinking.  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  No ADE.  Not yet due for refill on meds.  No new trauma, no dysuria or bowel changes, no weakness in the legs.    Asking about welplex shot for smoking cessation.  See instruction.   She'll get flu shot after her benefits come through, in the near future.  We planned for CPE in the spring with mammogram (if not already done by then- she'll call about f/u on this) and cholesterol check at that point.    Meds, vitals, and allergies reviewed.   ROS: See HPI.  Otherwise, noncontributory.  nad ncat Mmm rrr ctab abd soft Back w/o midline pain pain not increased with facet loading, but some better with forward flexion at the waist No weakness in ble and gait symmetric.

## 2011-11-04 NOTE — Assessment & Plan Note (Addendum)
Continue current meds.  No ADE, no sedation. Denies high risk behaviors.  I am treating her pain in good faith.  Will follow clinically.  There is no indication to repeat imaging at this time.  She'll schedule a f/u CPE in the spring of 2014 and will get a flu shot in a few weeks.

## 2011-11-04 NOTE — Patient Instructions (Addendum)
See if you can get a list of ingredients in the welplex shot.  Let me know.  I would get a flu shot each fall.   Schedule a physical for spring 2014 with labs ahead of time.  Call about your mammogram.   Take care.

## 2011-11-18 ENCOUNTER — Other Ambulatory Visit: Payer: Self-pay | Admitting: *Deleted

## 2011-11-18 MED ORDER — HYDROCODONE-ACETAMINOPHEN 10-325 MG PO TABS: 1.0000 | ORAL_TABLET | Freq: Four times a day (QID) | ORAL | Status: DC | PRN

## 2011-11-18 NOTE — Telephone Encounter (Signed)
Rx called in as directed.   

## 2011-11-18 NOTE — Telephone Encounter (Signed)
Please call in

## 2011-11-20 ENCOUNTER — Telehealth: Payer: Self-pay | Admitting: Family Medicine

## 2011-11-20 NOTE — Telephone Encounter (Signed)
Caller: Amberlin/Patient; Patient Name: Cristina Myers; PCP: Crawford Givens Clelia Croft) [facility]); [facility] Call[facility] Phone Number: [phone] CALLER REQUESTING WORK EXCUSE FOR BEING OUT OF WORK ON 11/19/11.  States yestrday morning she woke up with possible allergice reaction.  Lips were very swollen and cold sores present. Felt weak. Caller treated lips with cold compresses and took Benadryl. Symptoms reported as better today and is able to be at work today. 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   Caller requesting work excuse be faxed to work fax number: [phone]- to [employer].  Caller states  Dr. Para March  is aware that patient needs to keep this job. Advised caller will send request to Dr. Para March per her request.

## 2011-11-22 NOTE — Telephone Encounter (Signed)
Give her a note for the day listed.  If she has any illness in the future that limits work, she'll need OV.  Notify pt.

## 2011-11-23 NOTE — Telephone Encounter (Signed)
Note written and faxed to requested fax number.

## 2011-12-22 ENCOUNTER — Other Ambulatory Visit: Payer: Self-pay | Admitting: *Deleted

## 2011-12-22 NOTE — Telephone Encounter (Signed)
Faxed refill request.  Last dispensed 11/18/11.  Please advise.

## 2011-12-23 MED ORDER — HYDROCODONE-ACETAMINOPHEN 10-325 MG PO TABS: 1.0000 | ORAL_TABLET | Freq: Four times a day (QID) | ORAL | Status: DC | PRN

## 2011-12-23 NOTE — Telephone Encounter (Signed)
Please call in

## 2011-12-23 NOTE — Telephone Encounter (Signed)
[facility]icine [facility] to [facility]. 

## 2012-01-19 ENCOUNTER — Other Ambulatory Visit: Payer: Self-pay | Admitting: *Deleted

## 2012-01-19 NOTE — Telephone Encounter (Signed)
Route to pcp.

## 2012-01-20 ENCOUNTER — Other Ambulatory Visit: Payer: Self-pay | Admitting: *Deleted

## 2012-01-20 MED ORDER — HYDROCODONE-ACETAMINOPHEN 10-325 MG PO TABS
1.0000 | ORAL_TABLET | Freq: Four times a day (QID) | ORAL | Status: DC | PRN
Start: 2012-01-19 — End: 2012-02-16

## 2012-01-20 MED ORDER — CYCLOBENZAPRINE HCL 10 MG PO TABS: 5.0000 mg | ORAL_TABLET | Freq: Three times a day (TID) | ORAL | Status: DC | PRN

## 2012-01-20 NOTE — Telephone Encounter (Signed)
Rx called to pharmacy as instructed. 

## 2012-01-20 NOTE — Telephone Encounter (Signed)
Sent!

## 2012-01-20 NOTE — Telephone Encounter (Signed)
Please call in.  Thanks.   

## 2012-01-25 NOTE — Telephone Encounter (Signed)
Pt said refill for cyclobenzaprine was not given to Cristina Myers. I spoke with Homero Fellers and [place] and Medication phoned to [facility] as instructed. I apologized to pt and Pt notified done while on phone.

## 2012-01-31 ENCOUNTER — Emergency Department (HOSPITAL_COMMUNITY)
Admission: EM | Admit: 2012-01-31 | Discharge: 2012-01-31 | Disposition: A | Discharge status: Home / Self Care | Payer: Self-pay | Attending: Emergency Medicine | Admitting: Emergency Medicine

## 2012-01-31 ENCOUNTER — Emergency Department (HOSPITAL_COMMUNITY): Payer: Self-pay

## 2012-01-31 ENCOUNTER — Encounter (HOSPITAL_COMMUNITY): Payer: Self-pay | Admitting: *Deleted

## 2012-01-31 DIAGNOSIS — Z872 Personal history of diseases of the skin and subcutaneous tissue: Secondary | ICD-10-CM | POA: Insufficient documentation

## 2012-01-31 DIAGNOSIS — K219 Gastro-esophageal reflux disease without esophagitis: Secondary | ICD-10-CM | POA: Insufficient documentation

## 2012-01-31 DIAGNOSIS — E7800 Pure hypercholesterolemia, unspecified: Secondary | ICD-10-CM | POA: Insufficient documentation

## 2012-01-31 DIAGNOSIS — Y929 Unspecified place or not applicable: Secondary | ICD-10-CM | POA: Insufficient documentation

## 2012-01-31 DIAGNOSIS — R030 Elevated blood-pressure reading, without diagnosis of hypertension: Secondary | ICD-10-CM | POA: Insufficient documentation

## 2012-01-31 DIAGNOSIS — W010XXA Fall on same level from slipping, tripping and stumbling without subsequent striking against object, initial encounter: Secondary | ICD-10-CM | POA: Insufficient documentation

## 2012-01-31 DIAGNOSIS — M542 Cervicalgia: Secondary | ICD-10-CM | POA: Insufficient documentation

## 2012-01-31 DIAGNOSIS — S9030XA Contusion of unspecified foot, initial encounter: Secondary | ICD-10-CM | POA: Insufficient documentation

## 2012-01-31 DIAGNOSIS — Z8669 Personal history of other diseases of the nervous system and sense organs: Secondary | ICD-10-CM | POA: Insufficient documentation

## 2012-01-31 DIAGNOSIS — Z791 Long term (current) use of non-steroidal anti-inflammatories (NSAID): Secondary | ICD-10-CM | POA: Insufficient documentation

## 2012-01-31 DIAGNOSIS — F411 Generalized anxiety disorder: Secondary | ICD-10-CM | POA: Insufficient documentation

## 2012-01-31 DIAGNOSIS — Z79899 Other long term (current) drug therapy: Secondary | ICD-10-CM | POA: Insufficient documentation

## 2012-01-31 DIAGNOSIS — Y939 Activity, unspecified: Secondary | ICD-10-CM | POA: Insufficient documentation

## 2012-01-31 DIAGNOSIS — Z8659 Personal history of other mental and behavioral disorders: Secondary | ICD-10-CM | POA: Insufficient documentation

## 2012-01-31 DIAGNOSIS — Z8719 Personal history of other diseases of the digestive system: Secondary | ICD-10-CM | POA: Insufficient documentation

## 2012-01-31 DIAGNOSIS — F17200 Nicotine dependence, unspecified, uncomplicated: Secondary | ICD-10-CM | POA: Insufficient documentation

## 2012-01-31 MED ORDER — IBUPROFEN 600 MG PO TABS: 600.0000 mg | ORAL_TABLET | Freq: Four times a day (QID) | ORAL | Status: DC | PRN

## 2012-01-31 NOTE — ED Provider Notes (Signed)
Medical screening examination/treatment/procedure(s) were performed by non-physician practitioner and as supervising physician I was immediately available for consultation/collaboration.  Jones Skene, M.D.     Jones Skene, MD 01/31/12 [phone]

## 2012-01-31 NOTE — ED Notes (Signed)
Pt denies any questions upon discharge. 

## 2012-01-31 NOTE — ED Notes (Signed)
Pt states she slipped on her kitchen floor and thinks she might have broken her right foot.  Swelling noted to foot with pain with palp.  Pt states she was not dizzy or have any loc prior to or after the fall.  No other complaints at this time

## 2012-01-31 NOTE — ED Provider Notes (Signed)
History     CSN: [id number]  Arrival date & time 01/31/12  0046   First MD Initiated Contact with Patient 01/31/12 0113      Chief Complaint  Patient presents with  . Foot Pain    (Consider location/radiation/quality/duration/timing/severity/associated sxs/prior treatment) HPI Comments: PAteint slipped on floor hitting side of R foot earlier tonight now swollen No meds TA  Patient is a 51 y.o. female presenting with lower extremity pain. The history is provided by the patient.  Foot Pain This is a new problem. The problem has been unchanged. Pertinent negatives include no fever, joint swelling, numbness, rash or weakness.    Past Medical History  Diagnosis Date  . 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 Left knee pain   . Elevated blood pressure reading without diagnosis of hypertension   . Anxiety state, unspecified   . 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 Narrowing  . Constipation, chronic     history of  . Attention deficit disorder without mention of hyperactivity     meds per psych as of 12/2009  . Pure hypercholesterolemia   . Thumb pain     bilateral  . Family history of breast cancer   . Backache, unspecified     MRI lumbar region, dessicam disc's L3/4, L4/5: 5/S1. 05/2006.  [name] Kitchen Carpal tunnel syndrome     bilateral  . Barrett's esophagus     EGD 03/14/03.  EGD- prox to mid esophagus 10/12/06  . Tobacco use disorder   . GERD (gastroesophageal reflux disease)   . NSVD (normal spontaneous vaginal delivery)     x 4- 81, 83, 91, 98  . Miscarriage     1994    History reviewed. No pertinent past surgical history.  Family History  Problem Relation Age of Onset  . Hypertension Mother   . Obesity Mother   . Depression Mother   . Colon polyps Mother   . Arthritis Father     crippling rheumatoid  . Drug abuse Sister   . Alcohol abuse Brother   . Cancer Maternal Aunt     breast  . Cancer Maternal Uncle     colon  . Cancer  Maternal Grandmother     ovarian  . Diabetes Paternal Grandmother   . Parkinsonism Paternal Grandmother   . Stroke Neg Hx   . Cancer Maternal Aunt     colon    History  Substance Use Topics  . Smoking status: Current Every Day Smoker -- 1.0 packs/day for 40 years    Types: Cigarettes  . Smokeless tobacco: Never Used     Comment: Has smoked for over 30 years  . Alcohol Use: 1.0 oz/week    2 drink(s) per week     Comment: Serum alcohol 285 in [facility] 12/01/09- sig decrease in use as of 2012.    OB History    Grav Para Term Preterm Abortions TAB SAB Ect Mult Living                  Review of Systems  Constitutional: Negative for fever.  HENT: Negative.   Eyes: Negative.   Respiratory: Negative.   Cardiovascular: Negative for leg swelling.  Musculoskeletal: Negative for joint swelling.  Skin: Positive for color change. Negative for rash and wound.  Neurological: Negative for dizziness, tremors, weakness and numbness.    Allergies  Chantix  Home Medications   Current Outpatient Rx  Name  Route  Sig  Dispense  Refill  . ALBUTEROL SULFATE HFA 108 (90 BASE) MCG/ACT IN AERS      Two inhalations one minute apart two times a day as needed   1 Inhaler   1   . AMPHETAMINE-DEXTROAMPHETAMINE 30 MG PO TABS      Take one tablet by mouth three times a day          . CYCLOBENZAPRINE HCL 10 MG PO TABS   Oral   Take 0.5-1 tablets (5-10 mg total) by mouth every 8 (eight) hours as needed for muscle spasms.   30 tablet   0   . HYDROCODONE-ACETAMINOPHEN 10-325 MG PO TABS   Oral   Take 1 tablet by mouth every 6 (six) hours as needed for pain.   120 tablet   0     BP 146/90  Pulse 88  Temp 98 F (36.7 C) (Oral)  Resp 18  SpO2 97%  Physical Exam  Constitutional: She is oriented to person, place, and time. She appears well-developed and well-nourished.  HENT:  Head: Normocephalic.  Eyes: Pupils are equal, round, and reactive to light.  Neck: Normal range of motion.    Cardiovascular: Normal rate.   Pulmonary/Chest: Effort normal.  Musculoskeletal: She exhibits edema.       Feet:  Neurological: She is alert and oriented to person, place, and time.  Skin: Skin is warm.    ED Course  Procedures (including critical care time)  Labs Reviewed - No data to display Dg Foot Complete Right  01/31/2012  *RADIOLOGY REPORT*  Clinical Data: Fall with right foot injury.  RIGHT FOOT COMPLETE - 3+ VIEW  Comparison: None.  Findings: No acute fracture or dislocation is identified. No significant arthropathy.  Soft tissues are unremarkable.  IMPRESSION: No acute fracture.   Original Report Authenticated By: Irish Lack, M.D.      No diagnosis found.    MDM  Contusion         Arman Filter, NP 01/31/12 [phone]

## 2012-01-31 NOTE — ED Notes (Signed)
Ortho at bedside for pt ace wrap to right ankle.

## 2012-02-08 ENCOUNTER — Other Ambulatory Visit: Payer: Self-pay | Admitting: *Deleted

## 2012-02-08 NOTE — Telephone Encounter (Signed)
Faxed refill request. Please advise.  

## 2012-02-09 NOTE — Telephone Encounter (Signed)
Deny this.  Appears to be 10 days early.

## 2012-02-16 ENCOUNTER — Other Ambulatory Visit: Payer: Self-pay

## 2012-02-16 MED ORDER — HYDROCODONE-ACETAMINOPHEN 10-325 MG PO TABS: 1.0000 | ORAL_TABLET | Freq: Four times a day (QID) | ORAL | Status: DC | PRN

## 2012-02-16 NOTE — Telephone Encounter (Signed)
Please call in.  Okay to fill 02/17/12.  Thanks.

## 2012-02-16 NOTE — Telephone Encounter (Signed)
Pt said requesting Hydrocodone apap early (pt still has plenty of med and pt states takes several days to get refill from our office). Pt will ck with [facility] on 02/17/12 or 02/18/12.Please advise.

## 2012-02-16 NOTE — Telephone Encounter (Signed)
Rx phoned to pharmacy.  

## 2012-03-16 ENCOUNTER — Other Ambulatory Visit: Payer: Self-pay | Admitting: *Deleted

## 2012-03-16 MED ORDER — HYDROCODONE-ACETAMINOPHEN 10-325 MG PO TABS: 1.0000 | ORAL_TABLET | Freq: Four times a day (QID) | ORAL | Status: DC | PRN

## 2012-03-16 NOTE — Telephone Encounter (Signed)
Please call in.  Thanks.   

## 2012-03-19 NOTE — Telephone Encounter (Signed)
[facility]icine [facility] to [facility]. 

## 2012-03-21 NOTE — Telephone Encounter (Signed)
Pt request status of hydrocodone apap refill. Advised pt [facility] said med ready for pick up.

## 2012-04-12 ENCOUNTER — Other Ambulatory Visit: Payer: Self-pay | Admitting: *Deleted

## 2012-04-12 MED ORDER — CYCLOBENZAPRINE HCL 10 MG PO TABS: 5.0000 mg | ORAL_TABLET | Freq: Three times a day (TID) | ORAL | Status: DC | PRN

## 2012-04-12 MED ORDER — HYDROCODONE-ACETAMINOPHEN 10-325 MG PO TABS: 1.0000 | ORAL_TABLET | Freq: Four times a day (QID) | ORAL | Status: DC | PRN

## 2012-04-12 NOTE — Telephone Encounter (Signed)
Flexeril last filled 01/25/12 hyrdocodone last filled 03/19/12

## 2012-04-12 NOTE — Telephone Encounter (Signed)
Rx phoned to pharmacy.  

## 2012-04-12 NOTE — Telephone Encounter (Signed)
Flexeril sent.  Please call in the hydrocodone to be filled on/after 04/16/12.

## 2012-04-27 ENCOUNTER — Telehealth: Payer: Self-pay | Admitting: Family Medicine

## 2012-04-27 NOTE — Telephone Encounter (Signed)
Spoke with husband and advised pt needs and appt before medication can be filled. ( pt was sleep)

## 2012-04-27 NOTE — Telephone Encounter (Signed)
Need OV to discuss.  Would not rx meds in meantime.
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
## 2012-04-28 ENCOUNTER — Encounter: Payer: Self-pay | Admitting: Family Medicine

## 2012-04-28 ENCOUNTER — Ambulatory Visit (INDEPENDENT_AMBULATORY_CARE_PROVIDER_SITE_OTHER): Payer: Self-pay | Admitting: Family Medicine

## 2012-04-28 VITALS — BP 148/94 | HR 86 | Temp 97.4°F | Wt 123.5 lb

## 2012-04-28 DIAGNOSIS — F411 Generalized anxiety disorder: Secondary | ICD-10-CM

## 2012-04-28 MED ORDER — DIAZEPAM 5 MG PO TABS: 5.0000 mg | ORAL_TABLET | Freq: Three times a day (TID) | ORAL | Status: DC | PRN
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
## 2012-04-28 NOTE — Patient Instructions (Addendum)
Take the valium to up 3 times a day.  Cut out drinking alcohol.  Keep the counseling appointment.  If you aren't improving then let me know.  Out of work for now, plan to return on 05/02/12.

## 2012-04-29 NOTE — Assessment & Plan Note (Signed)
With sig work upheaval.  Keep appointment for counseling, add on valium, may need SSRI in long term.  Avoid etoh and drugs.  Husband supportive.  Safe at home. No SI/HI.  Okay for outpatient f/u.

## 2012-05-03 ENCOUNTER — Telehealth: Payer: Self-pay

## 2012-05-03 ENCOUNTER — Encounter: Payer: Self-pay | Admitting: *Deleted

## 2012-05-03 NOTE — Telephone Encounter (Signed)
Letter written and printed.

## 2012-05-03 NOTE — Telephone Encounter (Signed)
Pt was to return to work on 05/02/12. Pt said she is not able to return to work yet; saw Venancio Poisson counselor([phone]) today and has another appt with Mr Jarvis Newcomer on 05/09/12. Pt wants letter to remain out of work until after sees Mr Jarvis Newcomer on 05/09/12.Please advise. When asked how pt was doing now; x 2 pt said has good and bad days.

## 2012-05-03 NOTE — Telephone Encounter (Signed)
Please  give to patient

## 2012-05-03 NOTE — Telephone Encounter (Signed)
Patient advised. Note left at front desk for pick up.  

## 2012-05-03 NOTE — Telephone Encounter (Signed)
Please print a letter to have her out of work through 05/09/12.  Thanks.

## 2012-05-13 ENCOUNTER — Other Ambulatory Visit: Payer: Self-pay | Admitting: Family Medicine

## 2012-05-13 MED ORDER — HYDROCODONE-ACETAMINOPHEN 10-325 MG PO TABS
1.0000 | ORAL_TABLET | Freq: Four times a day (QID) | ORAL | Status: DC | PRN
Start: 2012-05-13 — End: 2012-06-06

## 2012-05-13 NOTE — Telephone Encounter (Signed)
plz phone in. 

## 2012-05-13 NOTE — Telephone Encounter (Signed)
Pt requesting refill for Hydrocodone.  Last filled 04/12/12.  Dr. Para March out of office.  Please advise.

## 2012-05-13 NOTE — Telephone Encounter (Signed)
Rx phoned to pharmacy.  

## 2012-05-30 ENCOUNTER — Encounter: Payer: Self-pay | Admitting: Family Medicine

## 2012-05-30 ENCOUNTER — Ambulatory Visit (INDEPENDENT_AMBULATORY_CARE_PROVIDER_SITE_OTHER): Payer: Self-pay | Admitting: Family Medicine

## 2012-05-30 VITALS — BP 128/98 | HR 94 | Temp 98.0°F | Wt 120.5 lb

## 2012-05-30 DIAGNOSIS — K529 Noninfective gastroenteritis and colitis, unspecified: Secondary | ICD-10-CM

## 2012-05-30 DIAGNOSIS — K5289 Other specified noninfective gastroenteritis and colitis: Secondary | ICD-10-CM

## 2012-05-30 MED ORDER — ONDANSETRON HCL 4 MG PO TABS: 4.0000 mg | ORAL_TABLET | Freq: Three times a day (TID) | ORAL | Status: DC | PRN

## 2012-05-30 NOTE — Patient Instructions (Addendum)
Use the zofran for nausea and vomiting.  Drink sips of fluids and this should gradually get better.  Out of work until vomiting and diarrhea are resolved.

## 2012-05-30 NOTE — Progress Notes (Signed)
Recently with vomiting and diarrhea.  4 days of symptoms. Mult sick contacts- "everybody has it."  Vomiting is decreased recently.  Little PO intake.  Fatigue noted. Sill with diarrhea.  Vomiting started before the diarrhea.  Diarrhea continues.  No blood in stool.  No fevers.  Norovirus likely in the community recently.    She is still in counseling.  Out of work since 05/26/12 when sx started.    Meds, vitals, and allergies reviewed.   ROS: See HPI.  Otherwise, noncontributory.  nad ncat Mmm rrr ctab abd soft, not ttp, normal BS

## 2012-05-31 DIAGNOSIS — K529 Noninfective gastroenteritis and colitis, unspecified: Secondary | ICD-10-CM | POA: Insufficient documentation

## 2012-05-31 NOTE — Assessment & Plan Note (Signed)
Likely noro, nontoxic, d/w pt about path phys.  Handwashing, zofran, sips of fluids, and f/u prn. Out of work in meantime. Benign exam today.

## 2012-06-06 ENCOUNTER — Other Ambulatory Visit: Payer: Self-pay | Admitting: Family Medicine

## 2012-06-06 NOTE — Telephone Encounter (Signed)
Ok to refill? Last filled 05/13/12.

## 2012-06-07 NOTE — Telephone Encounter (Signed)
Too early to fill now.  Okay to call in but only to fill on/after 06/10/12.  Thanks.

## 2012-06-07 NOTE — Telephone Encounter (Signed)
Medication phoned to pharmacy.  

## 2012-06-22 ENCOUNTER — Other Ambulatory Visit: Payer: Self-pay | Admitting: *Deleted

## 2012-06-22 MED ORDER — DIAZEPAM 5 MG PO TABS: 5.0000 mg | ORAL_TABLET | Freq: Three times a day (TID) | ORAL | Status: DC | PRN

## 2012-06-22 NOTE — Telephone Encounter (Signed)
Received faxed refill request from pharmacy. Last office visit 05/30/12. Is it okay to refill medication? 

## 2012-06-22 NOTE — Telephone Encounter (Signed)
Please call in

## 2012-06-22 NOTE — Telephone Encounter (Signed)
Prescription called to pharmacy as instructed. 

## 2012-07-07 ENCOUNTER — Other Ambulatory Visit: Payer: Self-pay | Admitting: Family Medicine

## 2012-07-07 MED ORDER — HYDROCODONE-ACETAMINOPHEN 10-325 MG PO TABS: ORAL_TABLET | ORAL | Status: DC

## 2012-07-07 NOTE — Telephone Encounter (Signed)
Left detailed message on voicemail.  

## 2012-07-07 NOTE — Telephone Encounter (Signed)
Fax refill request.

## 2012-07-07 NOTE — Telephone Encounter (Signed)
Please call in. Needs OV this summer.  Thanks.

## 2012-08-03 ENCOUNTER — Other Ambulatory Visit: Payer: Self-pay

## 2012-08-03 MED ORDER — CYCLOBENZAPRINE HCL 10 MG PO TABS: 5.0000 mg | ORAL_TABLET | Freq: Three times a day (TID) | ORAL | Status: DC | PRN

## 2012-08-03 MED ORDER — HYDROCODONE-ACETAMINOPHEN 10-325 MG PO TABS: ORAL_TABLET | ORAL | Status: DC

## 2012-08-03 MED ORDER — DIAZEPAM 5 MG PO TABS: 5.0000 mg | ORAL_TABLET | Freq: Three times a day (TID) | ORAL | Status: DC | PRN

## 2012-08-03 NOTE — Telephone Encounter (Signed)
Pt left v/m requesting refill cyclobenzaprine,diazepam and hydrocodone apap to [facility]; pt request refilled before 08/05/12.

## 2012-08-03 NOTE — Telephone Encounter (Signed)
Please call in vicodin.  Thanks.  She needs to come in for UDS if this hasn't been done yet.  Thanks.

## 2012-08-04 ENCOUNTER — Other Ambulatory Visit: Payer: Self-pay | Admitting: *Deleted

## 2012-08-04 NOTE — Telephone Encounter (Signed)
Diazepam Rx printed so I faxed it to [facility] and phoned in the Hydrocodone/APAP.

## 2012-09-01 ENCOUNTER — Other Ambulatory Visit: Payer: Self-pay | Admitting: *Deleted

## 2012-09-01 NOTE — Telephone Encounter (Signed)
Faxed refill request. Please advise.  

## 2012-09-02 ENCOUNTER — Encounter: Payer: Self-pay | Admitting: Family Medicine

## 2012-09-02 MED ORDER — CYCLOBENZAPRINE HCL 10 MG PO TABS: 5.0000 mg | ORAL_TABLET | Freq: Three times a day (TID) | ORAL | Status: AC | PRN

## 2012-09-02 MED ORDER — DIAZEPAM 5 MG PO TABS: 5.0000 mg | ORAL_TABLET | Freq: Three times a day (TID) | ORAL | Status: DC | PRN

## 2012-09-02 MED ORDER — HYDROCODONE-ACETAMINOPHEN 10-325 MG PO TABS: ORAL_TABLET | ORAL | Status: DC

## 2012-09-02 NOTE — Telephone Encounter (Signed)
She needs UDS when she picks up rxs.  Printed.

## 2012-09-02 NOTE — Telephone Encounter (Signed)
Patient notified by telephone that script is ready.

## 2012-09-19 ENCOUNTER — Ambulatory Visit: Payer: Self-pay | Admitting: Family Medicine

## 2012-09-27 ENCOUNTER — Telehealth: Payer: Self-pay | Admitting: Family Medicine

## 2012-09-27 ENCOUNTER — Encounter: Payer: Self-pay | Admitting: Family Medicine

## 2012-09-27 NOTE — Telephone Encounter (Signed)
Pt had a scheduled appt but when she arrived we turned her away due to her large bad debt.  Pt stated in March 2014 that she would pay on her bill in 2 weeks but did not return to pay anything on balance.  Pt has no insurance and could not pay for her visit on 8/18 or on her bad debt balance.  We told her she needed to pay or she would need to reschedule when she could pay something for her visit.  Dr. Para March said regardless of her balance he needs to see her for a visit.  I tried calling the patient's only phone number and it will not connect or ring at all.  I tried her work phone number and it is disconnected.  I tried calling her husband and the phone number listed for him is no longer his phone number.  I will send a letter today letting the pt know we are trying to contact her.  Dr. Para March is aware I am trying to reach the pt to come back in to see him.

## 2012-09-27 NOTE — Telephone Encounter (Signed)
Noted, thanks!

## 2012-09-30 ENCOUNTER — Ambulatory Visit: Payer: Self-pay | Admitting: Family Medicine

## 2012-10-06 ENCOUNTER — Telehealth: Payer: Self-pay

## 2012-10-06 ENCOUNTER — Ambulatory Visit (INDEPENDENT_AMBULATORY_CARE_PROVIDER_SITE_OTHER): Payer: Self-pay | Admitting: Family Medicine

## 2012-10-06 ENCOUNTER — Encounter: Payer: Self-pay | Admitting: Family Medicine

## 2012-10-06 VITALS — BP 152/92 | HR 88 | Temp 97.5°F | Wt 124.2 lb

## 2012-10-06 DIAGNOSIS — F411 Generalized anxiety disorder: Secondary | ICD-10-CM

## 2012-10-06 DIAGNOSIS — M542 Cervicalgia: Secondary | ICD-10-CM

## 2012-10-06 MED ORDER — HYDROCODONE-ACETAMINOPHEN 10-325 MG PO TABS: ORAL_TABLET | ORAL | Status: DC

## 2012-10-06 NOTE — Patient Instructions (Addendum)
I'll talk to [place] in the meantime.  Go to the lab on the way out.  We'll contact you with your lab report. Try to cut back on the hydrocodone, especially on the days you aren't working.  Take care.

## 2012-10-06 NOTE — Telephone Encounter (Signed)
Pt saw Dr Para March today and pt wanted to call and correct some information given to Dr Para March; pt drank a margarita on 10/05/12; pt said she wanted to be honest with Dr Para March because she appreciates his help. No call back necessary.

## 2012-10-06 NOTE — Progress Notes (Signed)
F/u for anxiety. Etoh on UDS prev. Discussed.  She admitted to drinking.  "I run to that and I've got to stop."  Last drank etoh on the day of the UDS, "it was a bad day."  Later admitted drinking yesterday.   Anxiety- d/w pt.  No Si/Hi.  Work and family concerns are difficult for her.  She is supporting her family financially.  She has been working at the post office.   We talked about not using BZD given her situation.  She wanted to be off BZD totally.  She hasn't taken BZD regularly.    She is in counseling now (Dr. Venancio Poisson).  She is trying to swim some and trying to walk for exercise.  She is seeing psychiatry also- [facility]- Dr. Tomasa Rand.  I called and left message with staff for Dr. Tomasa Rand about etoh.   Neck pain continues.  Prev imaged. No new sx but pain continues.  We talked about trying to taper her pain meds.  She typically needs the 4th dose on work days, less on other days.    Meds, vitals, and allergies reviewed.   ROS: See HPI.  Otherwise, noncontributory.  nad ncat Mmm rrr ctab Speech wnl No tremor Neck with L paraspinal C spine muscle tightness on exam but ROM okay today.  No rash S/S wnl for the ext.

## 2012-10-06 NOTE — Telephone Encounter (Signed)
Noted  

## 2012-10-06 NOTE — Assessment & Plan Note (Signed)
She'll try to taper down her dose, esp on off days from work.  I am treating her pain in good faith. Will follow clinically. There is no indication to repeat imaging at this time. Repeat UDS today. She doesn't appear sedated.   I didn't charge for the visit today given her financial situation.

## 2012-10-06 NOTE — Assessment & Plan Note (Signed)
Rare use of BZD, stop totally now.  She agrees. In counseling for etoh and anxiety.  She'll continue.  I called psych clinic to notify them.  Pt aware I was going to call.  Okay for outpatient f/u.

## 2012-10-17 ENCOUNTER — Telehealth: Payer: Self-pay | Admitting: Family Medicine

## 2012-10-17 ENCOUNTER — Encounter: Payer: Self-pay | Admitting: Family Medicine

## 2012-10-17 NOTE — Telephone Encounter (Signed)
Called and LMOVM.  Letter needs to be sent certified.  Thanks.

## 2012-10-18 NOTE — Telephone Encounter (Signed)
Noted. Thanks.

## 2012-10-18 NOTE — Telephone Encounter (Signed)
Letter sent to [facility] Mail room on 10/17/12 to be processed as a certified letter.  Pt should received before the end of this week.

## 2012-10-28 ENCOUNTER — Telehealth: Payer: Self-pay

## 2012-10-28 NOTE — Telephone Encounter (Signed)
Pt does not want to change doctors and pt has applied for medicaid. Pt believes will have medicaid approval in 45 days.Please advise. Pt request cb.

## 2012-10-28 NOTE — Telephone Encounter (Signed)
The medicaid isn't the issue, as long as we accept it.  The prev letter applies, re: drug use and controlled meds.

## 2012-10-28 NOTE — Telephone Encounter (Signed)
Pt left v/m requesting cb; no other message. Left v/m for pt to cb. 

## 2012-10-31 ENCOUNTER — Encounter: Payer: Self-pay | Admitting: Family Medicine

## 2012-10-31 NOTE — Telephone Encounter (Signed)
Patient advised.

## 2012-11-01 ENCOUNTER — Other Ambulatory Visit: Payer: Self-pay | Admitting: Family Medicine

## 2012-11-01 NOTE — Telephone Encounter (Signed)
Deny this.

## 2012-11-01 NOTE — Telephone Encounter (Signed)
Letter written by Dr. Para March on 10/17/12.  Letter sent via certified mail.  Letter was returned stating "Return to sender.  Unable to forward."  If patient comes in or calls we need to notify her of what the letter says.  Dr. Para March has already notified her that a letter was coming her way.

## 2012-11-04 NOTE — Telephone Encounter (Signed)
Pt has called back and said when spoke with Luann (pt spoke with [facility]) was told will refill for 30 more days and pt will need to find a new doctor. Pt expects this to be the case. Pt said Dr Para March is obligated to take care of her for 30 days and pt states she never got a certified letter. Advised pt in chart dated 11/01/12; the 10/17/12 certified letter was returned stating return to sender; unable to forward. Pt said she never go a Psychiatric nurse. Pt expects hydrocodone refill to [facility]. Pt request cb.

## 2012-11-04 NOTE — Telephone Encounter (Signed)
We don't have the obligation to fill this as she had illegal drugs in her UDS.  We have made every effort to contact her in the meantime.   I will not fill controlled meds for her given her UDS.   She can pick up a copy of the letter if she desires.

## 2012-11-04 NOTE — Telephone Encounter (Signed)
Left detailed message on voicemail.  

## 2012-11-07 NOTE — Telephone Encounter (Signed)
Pt said she is sorry and has let Dr Para March down but cannot get another physician until pt gets medicaid and pt is not sure when medicaid will be approved. Explained to pt Dr Para March does not have obligation to fill due to illegal drugs in pts system. Pt voiced understanding but still request refill.Please advise.

## 2012-11-07 NOTE — Telephone Encounter (Signed)
Patient notified as instructed by telephone. Pt voiced understanding.  

## 2012-11-07 NOTE — Telephone Encounter (Signed)
I will absolutely not fill her controlled meds.

## 2012-12-02 ENCOUNTER — Other Ambulatory Visit: Payer: Self-pay

## 2012-12-13 ENCOUNTER — Other Ambulatory Visit: Payer: Self-pay | Admitting: Nurse Practitioner

## 2012-12-13 ENCOUNTER — Ambulatory Visit
Admission: RE | Admit: 2012-12-13 | Discharge: 2012-12-13 | Disposition: A | Discharge status: Home / Self Care | Payer: Medicaid Other | Source: Ambulatory Visit | Attending: Nurse Practitioner | Admitting: Nurse Practitioner

## 2012-12-13 DIAGNOSIS — F17200 Nicotine dependence, unspecified, uncomplicated: Secondary | ICD-10-CM

## 2012-12-13 DIAGNOSIS — R062 Wheezing: Secondary | ICD-10-CM

## 2012-12-13 DIAGNOSIS — M542 Cervicalgia: Secondary | ICD-10-CM

## 2013-01-02 ENCOUNTER — Other Ambulatory Visit: Payer: Self-pay

## 2013-01-02 DIAGNOSIS — Z1231 Encounter for screening mammogram for malignant neoplasm of breast: Secondary | ICD-10-CM

## 2013-02-06 ENCOUNTER — Ambulatory Visit: Payer: Medicaid Other

## 2013-05-25 ENCOUNTER — Other Ambulatory Visit: Payer: Self-pay | Admitting: Orthopedic Surgery

## 2013-05-25 DIAGNOSIS — M545 Low back pain, unspecified: Secondary | ICD-10-CM

## 2013-06-01 ENCOUNTER — Ambulatory Visit
Admission: RE | Admit: 2013-06-01 | Discharge: 2013-06-01 | Disposition: A | Discharge status: Home / Self Care | Payer: Medicaid Other | Source: Ambulatory Visit | Attending: Orthopedic Surgery | Admitting: Orthopedic Surgery

## 2013-06-01 DIAGNOSIS — M545 Low back pain, unspecified: Secondary | ICD-10-CM

## 2014-01-08 ENCOUNTER — Emergency Department (HOSPITAL_COMMUNITY)
Admission: EM | Admit: 2014-01-08 | Discharge: 2014-01-08 | Disposition: A | Discharge status: Home / Self Care | Payer: Medicaid Other | Attending: Emergency Medicine | Admitting: Emergency Medicine

## 2014-01-08 ENCOUNTER — Encounter (HOSPITAL_COMMUNITY): Payer: Self-pay | Admitting: Emergency Medicine

## 2014-01-08 DIAGNOSIS — I10 Essential (primary) hypertension: Secondary | ICD-10-CM | POA: Insufficient documentation

## 2014-01-08 DIAGNOSIS — M549 Dorsalgia, unspecified: Secondary | ICD-10-CM | POA: Insufficient documentation

## 2014-01-08 DIAGNOSIS — F909 Attention-deficit hyperactivity disorder, unspecified type: Secondary | ICD-10-CM | POA: Insufficient documentation

## 2014-01-08 DIAGNOSIS — Z79899 Other long term (current) drug therapy: Secondary | ICD-10-CM | POA: Insufficient documentation

## 2014-01-08 DIAGNOSIS — Z720 Tobacco use: Secondary | ICD-10-CM | POA: Insufficient documentation

## 2014-01-08 DIAGNOSIS — Z8639 Personal history of other endocrine, nutritional and metabolic disease: Secondary | ICD-10-CM | POA: Insufficient documentation

## 2014-01-08 DIAGNOSIS — K219 Gastro-esophageal reflux disease without esophagitis: Secondary | ICD-10-CM | POA: Insufficient documentation

## 2014-01-08 DIAGNOSIS — F419 Anxiety disorder, unspecified: Secondary | ICD-10-CM | POA: Insufficient documentation

## 2014-01-08 DIAGNOSIS — K529 Noninfective gastroenteritis and colitis, unspecified: Secondary | ICD-10-CM | POA: Insufficient documentation

## 2014-01-08 DIAGNOSIS — Z8669 Personal history of other diseases of the nervous system and sense organs: Secondary | ICD-10-CM | POA: Insufficient documentation

## 2014-01-08 LAB — URINALYSIS, ROUTINE W REFLEX MICROSCOPIC
Bilirubin Urine: NEGATIVE
Glucose, UA: NEGATIVE mg/dL
HGB URINE DIPSTICK: NEGATIVE
Ketones, ur: 15 mg/dL — AB
Leukocytes, UA: NEGATIVE
Nitrite: NEGATIVE
PROTEIN: NEGATIVE mg/dL
Specific Gravity, Urine: 1.022 (ref 1.005–1.030)
Urobilinogen, UA: 1 mg/dL (ref 0.0–1.0)
pH: 6.5 (ref 5.0–8.0)

## 2014-01-08 LAB — CBC WITH DIFFERENTIAL/PLATELET
BASOS ABS: 0 10*3/uL (ref 0.0–0.1)
BASOS PCT: 0 % (ref 0–1)
EOS ABS: 0 10*3/uL (ref 0.0–0.7)
Eosinophils Relative: 1 % (ref 0–5)
HCT: 41.7 % (ref 36.0–46.0)
Hemoglobin: 14 g/dL (ref 12.0–15.0)
Lymphocytes Relative: 41 % (ref 12–46)
Lymphs Abs: 1 10*3/uL (ref 0.7–4.0)
MCH: 32.3 pg (ref 26.0–34.0)
MCHC: 33.6 g/dL (ref 30.0–36.0)
MCV: 96.1 fL (ref 78.0–100.0)
MONOS PCT: 13 % — AB (ref 3–12)
Monocytes Absolute: 0.3 10*3/uL (ref 0.1–1.0)
NEUTROS PCT: 45 % (ref 43–77)
Neutro Abs: 1.1 10*3/uL — ABNORMAL LOW (ref 1.7–7.7)
Platelets: 141 10*3/uL — ABNORMAL LOW (ref 150–400)
RBC: 4.34 MIL/uL (ref 3.87–5.11)
RDW: 11.8 % (ref 11.5–15.5)
WBC: 2.3 10*3/uL — ABNORMAL LOW (ref 4.0–10.5)

## 2014-01-08 LAB — BASIC METABOLIC PANEL
Anion gap: 16 — ABNORMAL HIGH (ref 5–15)
BUN: 15 mg/dL (ref 6–23)
CALCIUM: 10.5 mg/dL (ref 8.4–10.5)
CHLORIDE: 98 meq/L (ref 96–112)
CO2: 27 mEq/L (ref 19–32)
CREATININE: 0.61 mg/dL (ref 0.50–1.10)
GFR calc non Af Amer: >90 mL/min (ref >90)
Glucose, Bld: 100 mg/dL — ABNORMAL HIGH (ref 70–99)
Potassium: 3.7 mEq/L (ref 3.7–5.3)
Sodium: 141 mEq/L (ref 137–147)

## 2014-01-08 LAB — HIV ANTIBODY (ROUTINE TESTING W REFLEX): HIV 1&2 Ab, 4th Generation: NONREACTIVE

## 2014-01-08 MED ORDER — HYDROCHLOROTHIAZIDE 25 MG PO TABS: 25.0000 mg | ORAL_TABLET | Freq: Every day | ORAL | Status: DC

## 2014-01-08 MED ORDER — ATENOLOL 25 MG PO TABS: 25.0000 mg | ORAL_TABLET | Freq: Every day | ORAL | Status: DC

## 2014-01-08 MED ORDER — LORAZEPAM 2 MG/ML IJ SOLN
1.0000 mg | Freq: Once | INTRAMUSCULAR | Status: AC
  Administered 2014-01-08: 1 mg via INTRAVENOUS
  Filled 2014-01-08: qty 1

## 2014-01-08 MED ORDER — SODIUM CHLORIDE 0.9 % IV BOLUS (SEPSIS)
1000.0000 mL | Freq: Once | INTRAVENOUS | Status: AC
  Administered 2014-01-08: 1000 mL via INTRAVENOUS

## 2014-01-08 NOTE — ED Provider Notes (Signed)
CSN: [id number]     Arrival date & time 01/08/14  0944 History   First MD Initiated Contact with Patient 01/08/14 1003     Chief Complaint  Patient presents with  . Hypertension     (Consider location/radiation/quality/duration/timing/severity/associated sxs/prior Treatment) HPI Comments: Patient presents with nausea vomiting diarrhea. She states it's been going on for 3 days. She denies abdominal pain. She feels very tremulous. She hasn't been able to keep down her Adderall or her Ultram which is for her chronic back pain. She states she went to her PCP and was noted to have high blood pressure medicine and was sent over here for further evaluation. She states her blood pressures have been high in the past but they've always been come back down to normal. She denies any chest pain or shortness of breath. She denies any alcohol use. She denies any narcotic use other than Ultram. She denies any blood in her emesis or stools. She states she's been around other family members with similar symptoms of vomiting and diarrhea and feels that she caught it from them. She denies any fevers or chills. She has some achiness across her low back. She denies any numbness or weakness in her extremities. She denies any gait disturbances.  Patient is a 53 y.o. female presenting with hypertension.  Hypertension Pertinent negatives include no chest pain, no abdominal pain, no headaches and no shortness of breath.    Past Medical History  Diagnosis Date  . 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 Left knee pain   . Elevated blood pressure reading without diagnosis of hypertension   . Anxiety state, unspecified   . 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 Narrowing  . Constipation, chronic     history of  . Attention deficit disorder without mention of hyperactivity     meds per psych as of 12/2009  . Pure hypercholesterolemia   . Thumb pain     bilateral  . Family history of  breast cancer   . Backache, unspecified     MRI lumbar region, dessicam disc's L3/4, L4/5: 5/S1. 05/2006.  [name] Kitchen Carpal tunnel syndrome     bilateral  . Barrett's esophagus     EGD 03/14/03.  EGD- prox to mid esophagus 10/12/06  . Tobacco use disorder   . GERD (gastroesophageal reflux disease)   . NSVD (normal spontaneous vaginal delivery)     x 4- 81, 83, 91, 98  . Miscarriage     1994   History reviewed. No pertinent past surgical history. Family History  Problem Relation Age of Onset  . Hypertension Mother   . Obesity Mother   . Depression Mother   . Colon polyps Mother   . Arthritis Father     crippling rheumatoid  . Drug abuse Sister   . Alcohol abuse Brother   . Cancer Maternal Aunt     breast  . Cancer Maternal Uncle     colon  . Cancer Maternal Grandmother     ovarian  . Diabetes Paternal Grandmother   . Parkinsonism Paternal Grandmother   . Stroke Neg Hx   . Cancer Maternal Aunt     colon   History  Substance Use Topics  . Smoking status: Current Every Day Smoker -- 1.00 packs/day for 40 years    Types: Cigarettes  . Smokeless tobacco: Never Used     Comment: Has smoked for over 30 years  . Alcohol Use: 1.0  oz/week    2 drink(s) per week     Comment: Serum alcohol 285 in [facility] 12/01/09- sig decrease in use as of 2012.   OB History    No data available     Review of Systems  Constitutional: Positive for fatigue. Negative for fever, chills and diaphoresis.  HENT: Negative for congestion, rhinorrhea and sneezing.   Eyes: Negative.   Respiratory: Negative for cough, chest tightness and shortness of breath.   Cardiovascular: Negative for chest pain and leg swelling.  Gastrointestinal: Positive for nausea, vomiting and diarrhea. Negative for abdominal pain and blood in stool.  Genitourinary: Negative for frequency, hematuria, flank pain and difficulty urinating.  Musculoskeletal: Positive for back pain. Negative for arthralgias.  Skin: Negative for rash.   Neurological: Positive for tremors. Negative for dizziness, speech difficulty, weakness, numbness and headaches.      Allergies  Chantix  Home Medications   Prior to Admission medications   Medication Sig Start Date End Date Taking? Authorizing Provider  albuterol (VENTOLIN HFA) 108 (90 BASE) MCG/ACT inhaler Two inhalations one minute apart two times a day as needed Patient taking differently: Inhale 2 puffs into the lungs every 4 (four) hours as needed for wheezing or shortness of breath.  05/23/10  Yes Joaquim Nam, MD  amphetamine-dextroamphetamine (ADDERALL) 30 MG tablet Take 30 mg by mouth 4 (four) times daily.    Yes Historical Provider, MD  calcium carbonate (TUMS EX) 750 MG chewable tablet Chew 2 tablets by mouth every 4 (four) hours as needed for heartburn.   Yes Historical Provider, MD  diazepam (VALIUM) 10 MG tablet Take 10 mg by mouth every 6 (six) hours as needed for anxiety.   Yes Historical Provider, MD  traMADol (ULTRAM) 50 MG tablet Take 50 mg by mouth every 6 (six) hours as needed for moderate pain.   Yes Historical Provider, MD  hydrochlorothiazide (HYDRODIURIL) 25 MG tablet Take 1 tablet (25 mg total) by mouth daily. 01/08/14   Rolan Bucco, MD  HYDROcodone-acetaminophen (NORCO) 10-325 MG per tablet TAKE ONE TABLET BY MOUTH EVERY 6 HOURS AS NEEDED FOR PAIN Patient not taking: Reported on 01/08/2014 10/06/12   Joaquim Nam, MD  ondansetron (ZOFRAN) 4 MG tablet Take 1 tablet (4 mg total) by mouth every 8 (eight) hours as needed for nausea. Patient not taking: Reported on 01/08/2014 05/30/12   Joaquim Nam, MD   BP 169/115 mmHg  Pulse 70  Temp(Src) 97.7 F (36.5 C) (Oral)  Resp 18  Ht 5' 2.5" (1.588 m)  Wt 117 lb (53.071 kg)  BMI 21.05 kg/m2  SpO2 99% Physical Exam  Constitutional: She is oriented to person, place, and time. She appears well-developed and well-nourished.  HENT:  Head: Normocephalic and atraumatic.  Eyes: Pupils are equal, round, and reactive  to light.  Neck: Normal range of motion. Neck supple.  Cardiovascular: Normal rate, regular rhythm and normal heart sounds.   Pulmonary/Chest: Effort normal and breath sounds normal. No respiratory distress. She has no wheezes. She has no rales. She exhibits no tenderness.  Abdominal: Soft. Bowel sounds are normal. There is no tenderness. There is no rebound and no guarding.  Musculoskeletal: Normal range of motion. She exhibits no edema.  Lymphadenopathy:    She has no cervical adenopathy.  Neurological: She is alert and oriented to person, place, and time. She has normal strength. No cranial nerve deficit or sensory deficit. GCS eye subscore is 4. GCS verbal subscore is 5. GCS motor subscore is 6.  Skin: Skin is warm and dry. No rash noted.  Psychiatric: She has a normal mood and affect.  Slightly anxious and tremulous    ED Course  Procedures (including critical care time) Labs Review  Results for orders placed or performed during the hospital encounter of 01/08/14  Basic metabolic panel  Result Value Ref Range   Sodium 141 137 - 147 mEq/L   Potassium 3.7 3.7 - 5.3 mEq/L   Chloride 98 96 - 112 mEq/L   CO2 27 19 - 32 mEq/L   Glucose, Bld 100 (H) 70 - 99 mg/dL   BUN 15 6 - 23 mg/dL   Creatinine, Ser 1.610.61 0.50 - 1.10 mg/dL   Calcium 09.610.5 8.4 - 04.510.5 mg/dL   GFR calc non Af Amer >90 >90 mL/min   GFR calc Af Amer >90 >90 mL/min   Anion gap 16 (H) 5 - 15  CBC WITH DIFFERENTIAL  Result Value Ref Range   WBC 2.3 (L) 4.0 - 10.5 K/uL   RBC 4.34 3.87 - 5.11 MIL/uL   Hemoglobin 14.0 12.0 - 15.0 g/dL   HCT 40.941.7 81.136.0 - 91.446.0 %   MCV 96.1 78.0 - 100.0 fL   MCH 32.3 26.0 - 34.0 pg   MCHC 33.6 30.0 - 36.0 g/dL   RDW 78.211.8 95.611.5 - 21.315.5 %   Platelets 141 (L) 150 - 400 K/uL   Neutrophils Relative % 45 43 - 77 %   Neutro Abs 1.1 (L) 1.7 - 7.7 K/uL   Lymphocytes Relative 41 12 - 46 %   Lymphs Abs 1.0 0.7 - 4.0 K/uL   Monocytes Relative 13 (H) 3 - 12 %   Monocytes Absolute 0.3 0.1 - 1.0 K/uL    Eosinophils Relative 1 0 - 5 %   Eosinophils Absolute 0.0 0.0 - 0.7 K/uL   Basophils Relative 0 0 - 1 %   Basophils Absolute 0.0 0.0 - 0.1 K/uL  Urinalysis with microscopic  Result Value Ref Range   Color, Urine YELLOW YELLOW   APPearance CLOUDY (A) CLEAR   Specific Gravity, Urine 1.022 1.005 - 1.030   pH 6.5 5.0 - 8.0   Glucose, UA NEGATIVE NEGATIVE mg/dL   Hgb urine dipstick NEGATIVE NEGATIVE   Bilirubin Urine NEGATIVE NEGATIVE   Ketones, ur 15 (A) NEGATIVE mg/dL   Protein, ur NEGATIVE NEGATIVE mg/dL   Urobilinogen, UA 1.0 0.0 - 1.0 mg/dL   Nitrite NEGATIVE NEGATIVE   Leukocytes, UA NEGATIVE NEGATIVE   No results found.    Imaging Review No results found.   EKG Interpretation None      Date: 01/08/2014  Rate: 75  Rhythm: normal sinus rhythm  QRS Axis: normal  Intervals: normal  ST/T Wave abnormalities: nonspecific ST/T changes  Conduction Disutrbances:none  Narrative Interpretation:   Old EKG Reviewed: none available   MDM   Final diagnoses:  Essential hypertension  Gastroenteritis    Patient is given IV fluids. Her abdominal exam is benign. She was tolerating by mouth fluids after this. She is currently well appearing. I feel her symptoms are more consistent with a viral gastroenteritis. Her labs are unremarkable other than a neutropenia and mild thrombocytopenia. I did go ahead and order an HIV test. I did discuss this with the patient and encouraged her to follow-up with her primary care physician. Her blood pressures remained elevated. I have noted that she's had a diagnosis of some elevated blood pressures in the past. When ahead and gave her prescription for hydrochlorothiazide. She was discharged  home in good condition and encouraged to have close follow-up with her primary care provider.    Rolan BuccoMelanie Oneita Allmon, MD 01/08/14 [phone]

## 2014-01-08 NOTE — Discharge Instructions (Signed)
Hypertension °Hypertension, commonly called high blood pressure, is when the force of blood pumping through your arteries is too strong. Your arteries are the blood vessels that carry blood from your heart throughout your body. A blood pressure reading consists of a higher number over a lower number, such as 110/72. The higher number (systolic) is the pressure inside your arteries when your heart pumps. The lower number (diastolic) is the pressure inside your arteries when your heart relaxes. Ideally you want your blood pressure below 120/80. °Hypertension forces your heart to work harder to pump blood. Your arteries may become narrow or stiff. Having hypertension puts you at risk for heart disease, stroke, and other problems.  °RISK FACTORS °Some risk factors for high blood pressure are controllable. Others are not.  °Risk factors you cannot control include:  °· Race. You may be at higher risk if you are African American. °· Age. Risk increases with age. °· Gender. Men are at higher risk than women before age 45 years. After age 65, women are at higher risk than men. °Risk factors you can control include: °· Not getting enough exercise or physical activity. °· Being overweight. °· Getting too much fat, sugar, calories, or salt in your diet. °· Drinking too much alcohol. °SIGNS AND SYMPTOMS °Hypertension does not usually cause signs or symptoms. Extremely high blood pressure (hypertensive crisis) may cause headache, anxiety, shortness of breath, and nosebleed. °DIAGNOSIS  °To check if you have hypertension, your health care provider will measure your blood pressure while you are seated, with your arm held at the level of your heart. It should be measured at least twice using the same arm. Certain conditions can cause a difference in blood pressure between your right and left arms. A blood pressure reading that is higher than normal on one occasion does not mean that you need treatment. If one blood pressure reading  is high, ask your health care provider about having it checked again. °TREATMENT  °Treating high blood pressure includes making lifestyle changes and possibly taking medicine. Living a healthy lifestyle can help lower high blood pressure. You may need to change some of your habits. °Lifestyle changes may include: °· Following the DASH diet. This diet is high in fruits, vegetables, and whole grains. It is low in salt, red meat, and added sugars. °· Getting at least 2½ hours of brisk physical activity every week. °· Losing weight if necessary. °· Not smoking. °· Limiting alcoholic beverages. °· Learning ways to reduce stress. ° If lifestyle changes are not enough to get your blood pressure under control, your health care provider may prescribe medicine. You may need to take more than one. Work closely with your health care provider to understand the risks and benefits. °HOME CARE INSTRUCTIONS °· Have your blood pressure rechecked as directed by your health care provider.   °· Take medicines only as directed by your health care provider. Follow the directions carefully. Blood pressure medicines must be taken as prescribed. The medicine does not work as well when you skip doses. Skipping doses also puts you at risk for problems.   °· Do not smoke.   °· Monitor your blood pressure at home as directed by your health care provider.  °SEEK MEDICAL CARE IF:  °· You think you are having a reaction to medicines taken. °· You have recurrent headaches or feel dizzy. °· You have swelling in your ankles. °· You have trouble with your vision. °SEEK IMMEDIATE MEDICAL CARE IF: °· You develop a severe headache or confusion. °·   You have unusual weakness, numbness, or feel faint. °· You have severe chest or abdominal pain. °· You vomit repeatedly. °· You have trouble breathing. °MAKE SURE YOU:  °· Understand these instructions. °· Will watch your condition. °· Will get help right away if you are not doing well or get worse. °Document  Released: 01/19/2005 Document Revised: 06/05/2013 Document Reviewed: 11/11/2012 °Exit[employer]® Patient Information ©2015 [employer]. This information is not intended to replace advice given to you by your health care provider. Make sure you discuss any questions you have with your health care provider. ° °Viral Gastroenteritis °Viral gastroenteritis is also known as stomach flu. This condition affects the stomach and intestinal tract. It can cause sudden diarrhea and vomiting. The illness typically lasts 3 to 8 days. Most people develop an immune response that eventually gets rid of the virus. While this natural response develops, the virus can make you quite ill. °CAUSES  °Many different viruses can cause gastroenteritis, such as rotavirus or noroviruses. You can catch one of these viruses by consuming contaminated food or water. You may also catch a virus by sharing utensils or other personal items with an infected person or by touching a contaminated surface. °SYMPTOMS  °The most common symptoms are diarrhea and vomiting. These problems can cause a severe loss of body fluids (dehydration) and a body salt (electrolyte) imbalance. Other symptoms may include: °· Fever. °· Headache. °· Fatigue. °· Abdominal pain. °DIAGNOSIS  °Your caregiver can usually diagnose viral gastroenteritis based on your symptoms and a physical exam. A stool sample may also be taken to test for the presence of viruses or other infections. °TREATMENT  °This illness typically goes away on its own. Treatments are aimed at rehydration. The most serious cases of viral gastroenteritis involve vomiting so severely that you are not able to keep fluids down. In these cases, fluids must be given through an intravenous line (IV). °HOME CARE INSTRUCTIONS  °· Drink enough fluids to keep your urine clear or pale yellow. Drink small amounts of fluids frequently and increase the amounts as tolerated. °· Ask your caregiver for specific rehydration  instructions. °· Avoid: °¨ Foods high in sugar. °¨ Alcohol. °¨ Carbonated drinks. °¨ Tobacco. °¨ Juice. °¨ Caffeine drinks. °¨ Extremely hot or cold fluids. °¨ Fatty, greasy foods. °¨ Too much intake of anything at one time. °¨ Dairy products until 24 to 48 hours after diarrhea stops. °· You may consume probiotics. Probiotics are active cultures of beneficial bacteria. They may lessen the amount and number of diarrheal stools in adults. Probiotics can be found in yogurt with active cultures and in supplements. °· Wash your hands well to avoid spreading the virus. °· Only take over-the-counter or prescription medicines for pain, discomfort, or fever as directed by your caregiver. Do not give aspirin to children. Antidiarrheal medicines are not recommended. °· Ask your caregiver if you should continue to take your regular prescribed and over-the-counter medicines. °· Keep all follow-up appointments as directed by your caregiver. °SEEK IMMEDIATE MEDICAL CARE IF:  °· You are unable to keep fluids down. °· You do not urinate at least once every 6 to 8 hours. °· You develop shortness of breath. °· You notice blood in your stool or vomit. This may look like coffee grounds. °· You have abdominal pain that increases or is concentrated in one small area (localized). °· You have persistent vomiting or diarrhea. °· You have a fever. °· The patient is a child younger than 3 months, and he   or she has a fever. °· The patient is a child older than 3 months, and he or she has a fever and persistent symptoms. °· The patient is a child older than 3 months, and he or she has a fever and symptoms suddenly get worse. °· The patient is a baby, and he or she has no tears when crying. °MAKE SURE YOU:  °· Understand these instructions. °· Will watch your condition. °· Will get help right away if you are not doing well or get worse. °Document Released: 01/19/2005 Document Revised: 04/13/2011 Document Reviewed: 11/05/2010 °ExitCare® Patient  Information ©2015 [employer]. This information is not intended to replace advice given to you by your health care provider. Make sure you discuss any questions you have with your health care provider. ° °

## 2014-01-08 NOTE — ED Notes (Signed)
MD at bedside. 

## 2014-01-08 NOTE — ED Notes (Signed)
MD at bedside. EDP BELFI PRESENT TO EVALUATE THIS PT 

## 2014-01-08 NOTE — ED Notes (Signed)
Per pt, states has not been able to keep any food down since last Thurs, states also having diarrhea-saw PCP today and was sent here for high BP work up and fluids

## 2014-01-08 NOTE — ED Notes (Signed)
Gave pt cup of sprite

## 2014-06-25 ENCOUNTER — Emergency Department (HOSPITAL_COMMUNITY)
Admission: EM | Admit: 2014-06-25 | Discharge: 2014-06-25 | Disposition: A | Discharge status: Home / Self Care | Payer: 59 | Attending: Emergency Medicine | Admitting: Emergency Medicine

## 2014-06-25 ENCOUNTER — Emergency Department (HOSPITAL_COMMUNITY): Payer: 59

## 2014-06-25 ENCOUNTER — Encounter (HOSPITAL_COMMUNITY): Payer: Self-pay

## 2014-06-25 DIAGNOSIS — F909 Attention-deficit hyperactivity disorder, unspecified type: Secondary | ICD-10-CM | POA: Diagnosis not present

## 2014-06-25 DIAGNOSIS — S0083XA Contusion of other part of head, initial encounter: Secondary | ICD-10-CM | POA: Diagnosis not present

## 2014-06-25 DIAGNOSIS — Z720 Tobacco use: Secondary | ICD-10-CM | POA: Insufficient documentation

## 2014-06-25 DIAGNOSIS — Y998 Other external cause status: Secondary | ICD-10-CM | POA: Diagnosis not present

## 2014-06-25 DIAGNOSIS — Y92008 Other place in unspecified non-institutional (private) residence as the place of occurrence of the external cause: Secondary | ICD-10-CM | POA: Insufficient documentation

## 2014-06-25 DIAGNOSIS — Z79899 Other long term (current) drug therapy: Secondary | ICD-10-CM | POA: Insufficient documentation

## 2014-06-25 DIAGNOSIS — W19XXXA Unspecified fall, initial encounter: Secondary | ICD-10-CM

## 2014-06-25 DIAGNOSIS — S0990XA Unspecified injury of head, initial encounter: Secondary | ICD-10-CM

## 2014-06-25 DIAGNOSIS — Z872 Personal history of diseases of the skin and subcutaneous tissue: Secondary | ICD-10-CM | POA: Insufficient documentation

## 2014-06-25 DIAGNOSIS — F419 Anxiety disorder, unspecified: Secondary | ICD-10-CM | POA: Insufficient documentation

## 2014-06-25 DIAGNOSIS — Z8639 Personal history of other endocrine, nutritional and metabolic disease: Secondary | ICD-10-CM | POA: Insufficient documentation

## 2014-06-25 DIAGNOSIS — W01198A Fall on same level from slipping, tripping and stumbling with subsequent striking against other object, initial encounter: Secondary | ICD-10-CM | POA: Insufficient documentation

## 2014-06-25 DIAGNOSIS — K219 Gastro-esophageal reflux disease without esophagitis: Secondary | ICD-10-CM | POA: Insufficient documentation

## 2014-06-25 DIAGNOSIS — Y9389 Activity, other specified: Secondary | ICD-10-CM | POA: Diagnosis not present

## 2014-06-25 LAB — BASIC METABOLIC PANEL
Anion gap: 12 (ref 5–15)
BUN: 25 mg/dL — AB (ref 6–20)
CALCIUM: 8.9 mg/dL (ref 8.9–10.3)
CO2: 26 mmol/L (ref 22–32)
CREATININE: 0.45 mg/dL (ref 0.44–1.00)
Chloride: 104 mmol/L (ref 101–111)
GFR calc Af Amer: >60 mL/min (ref >60)
GFR calc non Af Amer: >60 mL/min (ref >60)
GLUCOSE: 102 mg/dL — AB (ref 65–99)
Potassium: 3.5 mmol/L (ref 3.5–5.1)
SODIUM: 142 mmol/L (ref 135–145)

## 2014-06-25 LAB — CBC WITH DIFFERENTIAL/PLATELET
Basophils Absolute: 0 10*3/uL (ref 0.0–0.1)
Basophils Relative: 1 % (ref 0–1)
EOS PCT: 3 % (ref 0–5)
Eosinophils Absolute: 0.1 10*3/uL (ref 0.0–0.7)
HCT: 41.3 % (ref 36.0–46.0)
HEMOGLOBIN: 14.4 g/dL (ref 12.0–15.0)
Lymphocytes Relative: 48 % — ABNORMAL HIGH (ref 12–46)
Lymphs Abs: 1.8 10*3/uL (ref 0.7–4.0)
MCH: 33.9 pg (ref 26.0–34.0)
MCHC: 34.9 g/dL (ref 30.0–36.0)
MCV: 97.2 fL (ref 78.0–100.0)
MONO ABS: 0.5 10*3/uL (ref 0.1–1.0)
MONOS PCT: 15 % — AB (ref 3–12)
Neutro Abs: 1.2 10*3/uL — ABNORMAL LOW (ref 1.7–7.7)
Neutrophils Relative %: 33 % — ABNORMAL LOW (ref 43–77)
PLATELETS: 185 10*3/uL (ref 150–400)
RBC: 4.25 MIL/uL (ref 3.87–5.11)
RDW: 12 % (ref 11.5–15.5)
WBC: 3.6 10*3/uL — AB (ref 4.0–10.5)

## 2014-06-25 MED ORDER — MELOXICAM 15 MG PO TABS: 15.0000 mg | ORAL_TABLET | Freq: Every day | ORAL | Status: DC

## 2014-06-25 NOTE — ED Provider Notes (Signed)
CSN: [id number]     Arrival date & time 06/25/14  1030 History   First MD Initiated Contact with Patient 06/25/14 1039     Chief Complaint  Patient presents with  . Dizziness  . Headache     (Consider location/radiation/quality/duration/timing/severity/associated sxs/prior Treatment) HPI Comments: Patient is a 54 year old female who presents with a headache for the past 5 days. Patient reports her daughter's birthday was 5 days ago and she was drinking alcohol and taking valium. She reports tripping and hitting her head on the dresser. Since then, she has had a throbbing headache all over her head. The pain has been persistent since the injury. She reports excessive sleepiness and has been sleeping most of the day since the injury. She tried aleve for pain without relief. No aggravating/alleviating factors. No other associated symptoms.    Past Medical History  Diagnosis Date  . 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 Left knee pain   . Elevated blood pressure reading without diagnosis of hypertension   . Anxiety state, unspecified   . 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 Narrowing  . Constipation, chronic     history of  . Attention deficit disorder without mention of hyperactivity     meds per psych as of 12/2009  . Pure hypercholesterolemia   . Thumb pain     bilateral  . Family history of breast cancer   . Backache, unspecified     MRI lumbar region, dessicam disc's L3/4, L4/5: 5/S1. 05/2006.  [name] Kitchen Carpal tunnel syndrome     bilateral  . Barrett's esophagus     EGD 03/14/03.  EGD- prox to mid esophagus 10/12/06  . Tobacco use disorder   . GERD (gastroesophageal reflux disease)   . NSVD (normal spontaneous vaginal delivery)     x 4- 81, 83, 91, 98  . Miscarriage     1994   History reviewed. No pertinent past surgical history. Family History  Problem Relation Age of Onset  . Hypertension Mother   . Obesity Mother   . Depression Mother   .  Colon polyps Mother   . Arthritis Father     crippling rheumatoid  . Drug abuse Sister   . Alcohol abuse Brother   . Cancer Maternal Aunt     breast  . Cancer Maternal Uncle     colon  . Cancer Maternal Grandmother     ovarian  . Diabetes Paternal Grandmother   . Parkinsonism Paternal Grandmother   . Stroke Neg Hx   . Cancer Maternal Aunt     colon   History  Substance Use Topics  . Smoking status: Current Every Day Smoker -- 1.00 packs/day for 40 years    Types: Cigarettes  . Smokeless tobacco: Never Used     Comment: Has smoked for over 30 years  . Alcohol Use: 1.0 oz/week    2 drink(s) per week     Comment: Serum alcohol 285 in [facility] 12/01/09- sig decrease in use as of 2012.   OB History    No data available     Review of Systems  Constitutional: Negative for fever, chills and fatigue.  HENT: Negative for trouble swallowing.   Eyes: Negative for visual disturbance.  Respiratory: Negative for shortness of breath.   Cardiovascular: Negative for chest pain and palpitations.  Gastrointestinal: Negative for nausea, vomiting, abdominal pain and diarrhea.  Genitourinary: Negative for dysuria and difficulty urinating.  Musculoskeletal:  Negative for arthralgias and neck pain.  Skin: Negative for color change.  Neurological: Positive for headaches. Negative for dizziness and weakness.  Psychiatric/Behavioral: Negative for dysphoric mood.      Allergies  Chantix  Home Medications   Prior to Admission medications   Medication Sig Start Date End Date Taking? Authorizing Provider  albuterol (VENTOLIN HFA) 108 (90 BASE) MCG/ACT inhaler Two inhalations one minute apart two times a day as needed Patient taking differently: Inhale 2 puffs into the lungs every 4 (four) hours as needed for wheezing or shortness of breath.  05/23/10   Joaquim Nam, MD  amphetamine-dextroamphetamine (ADDERALL) 30 MG tablet Take 30 mg by mouth 4 (four) times daily.     Historical Provider, MD   calcium carbonate (TUMS EX) 750 MG chewable tablet Chew 2 tablets by mouth every 4 (four) hours as needed for heartburn.    Historical Provider, MD  diazepam (VALIUM) 10 MG tablet Take 10 mg by mouth every 6 (six) hours as needed for anxiety.    Historical Provider, MD  hydrochlorothiazide (HYDRODIURIL) 25 MG tablet Take 1 tablet (25 mg total) by mouth daily. 01/08/14   Rolan Bucco, MD  HYDROcodone-acetaminophen (NORCO) 10-325 MG per tablet TAKE ONE TABLET BY MOUTH EVERY 6 HOURS AS NEEDED FOR PAIN Patient not taking: Reported on 01/08/2014 10/06/12   Joaquim Nam, MD  ondansetron (ZOFRAN) 4 MG tablet Take 1 tablet (4 mg total) by mouth every 8 (eight) hours as needed for nausea. Patient not taking: Reported on 01/08/2014 05/30/12   Joaquim Nam, MD  traMADol (ULTRAM) 50 MG tablet Take 50 mg by mouth every 6 (six) hours as needed for moderate pain.    Historical Provider, MD   BP 161/102 mmHg  Pulse 85  Temp(Src) 98.9 F (37.2 C) (Oral)  Resp 16  SpO2 95% Physical Exam  Constitutional: She is oriented to person, place, and time. She appears well-developed and well-nourished. No distress.  HENT:  Head: Normocephalic and atraumatic.  Contusion noted to right forehead. Occipital scalp tender to palpation. No open wounds.   Eyes: Conjunctivae and EOM are normal. Pupils are equal, round, and reactive to light.  Neck: Normal range of motion.  Cardiovascular: Normal rate and regular rhythm.  Exam reveals no gallop and no friction rub.   No murmur heard. Pulmonary/Chest: Effort normal and breath sounds normal. She has no wheezes. She has no rales. She exhibits no tenderness.  Abdominal: Soft. She exhibits no distension. There is no tenderness. There is no rebound.  Musculoskeletal: Normal range of motion.  Neurological: She is alert and oriented to person, place, and time. No cranial nerve deficit. Coordination normal.  Extremity strength and sensation equal and intact bilaterally. Patient has  an unsteady gait. Speech is goal-oriented.   Skin: Skin is warm and dry.  Psychiatric: She has a normal mood and affect. Her behavior is normal.  Nursing note and vitals reviewed.   ED Course  Procedures (including critical care time) Labs Review Labs Reviewed  CBC WITH DIFFERENTIAL/PLATELET - Abnormal; Notable for the following:    WBC 3.6 (*)    Neutrophils Relative % 33 (*)    Neutro Abs 1.2 (*)    Lymphocytes Relative 48 (*)    Monocytes Relative 15 (*)    All other components within normal limits  BASIC METABOLIC PANEL - Abnormal; Notable for the following:    Glucose, Bld 102 (*)    BUN 25 (*)    All other components within  normal limits    Imaging Review Ct Head Wo Contrast  06/25/2014   CLINICAL DATA:  Fall with head trauma.  Dizziness and headache.  EXAM: CT HEAD WITHOUT CONTRAST  CT MAXILLOFACIAL WITHOUT CONTRAST  CT CERVICAL SPINE WITHOUT CONTRAST  TECHNIQUE: Multidetector CT imaging of the head, cervical spine, and maxillofacial structures were performed using the standard protocol without intravenous contrast. Multiplanar CT image reconstructions of the cervical spine and maxillofacial structures were also generated.  COMPARISON:  12/01/2009  FINDINGS: CT HEAD FINDINGS  No evidence for acute hemorrhage, mass lesion, midline shift, hydrocephalus or large infarct. There is mucosal thickening in the left ethmoid air cells. No evidence for calvarial fracture.  CT MAXILLOFACIAL FINDINGS  Visualized intracranial structures are within normal limits. Bilateral globes are intact. There is mild scalp swelling along the right side of the forehead. Mandible is intact. Zygomatic arches are intact. Pterygoid plates are intact. Mild mucosal thickening in the maxillary sinuses. There is moderate mucosal disease in the left ethmoid air cells. No evidence for an acute facial bone fracture. Nasal bones appear to be intact.  CT CERVICAL SPINE FINDINGS  Lung apices are clear without a pneumothorax.  There is no significant soft tissue swelling in the neck. No gross abnormality to the thyroid tissue. Multilevel degenerative changes. Bilateral uncovertebral spurring with foraminal narrowing at C4-C5. Extensive bilateral uncovertebral spurring at C5-C6 with foraminal narrowing, right side greater the left. Disc osteophyte complex at C6-C7. Alignment of cervical spine is normal. There is facet arthropathy, particularly on the left side of C3-C4. Disc space narrowing most prominent at C5-C6 and C6-C7.  IMPRESSION: No acute intracranial abnormality.  Mild soft tissue swelling along the right forehead. No evidence for a facial bone fracture. Mild paranasal sinus disease.  No acute bone abnormality in the cervical spine. Multilevel degenerative changes in the cervical spine.   Electronically Signed   By: Richarda Overlie M.D.   On: 06/25/2014 13:40   Ct Cervical Spine Wo Contrast  06/25/2014   CLINICAL DATA:  Fall with head trauma.  Dizziness and headache.  EXAM: CT HEAD WITHOUT CONTRAST  CT MAXILLOFACIAL WITHOUT CONTRAST  CT CERVICAL SPINE WITHOUT CONTRAST  TECHNIQUE: Multidetector CT imaging of the head, cervical spine, and maxillofacial structures were performed using the standard protocol without intravenous contrast. Multiplanar CT image reconstructions of the cervical spine and maxillofacial structures were also generated.  COMPARISON:  12/01/2009  FINDINGS: CT HEAD FINDINGS  No evidence for acute hemorrhage, mass lesion, midline shift, hydrocephalus or large infarct. There is mucosal thickening in the left ethmoid air cells. No evidence for calvarial fracture.  CT MAXILLOFACIAL FINDINGS  Visualized intracranial structures are within normal limits. Bilateral globes are intact. There is mild scalp swelling along the right side of the forehead. Mandible is intact. Zygomatic arches are intact. Pterygoid plates are intact. Mild mucosal thickening in the maxillary sinuses. There is moderate mucosal disease in the left  ethmoid air cells. No evidence for an acute facial bone fracture. Nasal bones appear to be intact.  CT CERVICAL SPINE FINDINGS  Lung apices are clear without a pneumothorax. There is no significant soft tissue swelling in the neck. No gross abnormality to the thyroid tissue. Multilevel degenerative changes. Bilateral uncovertebral spurring with foraminal narrowing at C4-C5. Extensive bilateral uncovertebral spurring at C5-C6 with foraminal narrowing, right side greater the left. Disc osteophyte complex at C6-C7. Alignment of cervical spine is normal. There is facet arthropathy, particularly on the left side of C3-C4. Disc space narrowing most prominent  at C5-C6 and C6-C7.  IMPRESSION: No acute intracranial abnormality.  Mild soft tissue swelling along the right forehead. No evidence for a facial bone fracture. Mild paranasal sinus disease.  No acute bone abnormality in the cervical spine. Multilevel degenerative changes in the cervical spine.   Electronically Signed   By: Richarda OverlieAdam  Henn M.D.   On: 06/25/2014 13:40   Ct Maxillofacial Wo Cm  06/25/2014   CLINICAL DATA:  Fall with head trauma.  Dizziness and headache.  EXAM: CT HEAD WITHOUT CONTRAST  CT MAXILLOFACIAL WITHOUT CONTRAST  CT CERVICAL SPINE WITHOUT CONTRAST  TECHNIQUE: Multidetector CT imaging of the head, cervical spine, and maxillofacial structures were performed using the standard protocol without intravenous contrast. Multiplanar CT image reconstructions of the cervical spine and maxillofacial structures were also generated.  COMPARISON:  12/01/2009  FINDINGS: CT HEAD FINDINGS  No evidence for acute hemorrhage, mass lesion, midline shift, hydrocephalus or large infarct. There is mucosal thickening in the left ethmoid air cells. No evidence for calvarial fracture.  CT MAXILLOFACIAL FINDINGS  Visualized intracranial structures are within normal limits. Bilateral globes are intact. There is mild scalp swelling along the right side of the forehead. Mandible  is intact. Zygomatic arches are intact. Pterygoid plates are intact. Mild mucosal thickening in the maxillary sinuses. There is moderate mucosal disease in the left ethmoid air cells. No evidence for an acute facial bone fracture. Nasal bones appear to be intact.  CT CERVICAL SPINE FINDINGS  Lung apices are clear without a pneumothorax. There is no significant soft tissue swelling in the neck. No gross abnormality to the thyroid tissue. Multilevel degenerative changes. Bilateral uncovertebral spurring with foraminal narrowing at C4-C5. Extensive bilateral uncovertebral spurring at C5-C6 with foraminal narrowing, right side greater the left. Disc osteophyte complex at C6-C7. Alignment of cervical spine is normal. There is facet arthropathy, particularly on the left side of C3-C4. Disc space narrowing most prominent at C5-C6 and C6-C7.  IMPRESSION: No acute intracranial abnormality.  Mild soft tissue swelling along the right forehead. No evidence for a facial bone fracture. Mild paranasal sinus disease.  No acute bone abnormality in the cervical spine. Multilevel degenerative changes in the cervical spine.   Electronically Signed   By: Richarda OverlieAdam  Henn M.D.   On: 06/25/2014 13:40     EKG Interpretation None      MDM   Final diagnoses:  Head injury, initial encounter  Fall, initial encounter    10:55 AM CT head and cervical spine pending. Vitals stable and patient afebrile. Labs pending.   Imaging unremarkable for acute changes. Patient will be discharged with instructions not to mix alcohol and valium.     Emilia BeckKaitlyn Jaelah Hauth, PA-C 06/27/14 [id number]  Doug SouSam Jacubowitz, MD 06/27/14 [phone]

## 2014-06-25 NOTE — Discharge Instructions (Signed)
Do not drink alcohol and take valium-this is a deadly combination. Refer to attached documents for more information. Take mobic as needed for pain.

## 2014-06-25 NOTE — ED Provider Notes (Signed)
History is obtained from patient and from patient's husband patient tripped over her dog 5 days ago causing her to strike her head on a dresser. Since the event she's been more sleepy. She presently complains of "tingling in her head" and "knots on her head as a result of the fall. She admits to drinking alcohol 5 days ago. Her husband reports that she's gotten up to go to the bathroom and has eaten in the past 5 days. On exam patient is alert Glasgow Coma Score 15 HEENT exam there is a greenish yellow ecchymosis to the forehead prostate 57 is diameter slightly to the right of center otherwise normocephalic atraumatic neck supple lungs clear breath sounds heart regular rate and rhythm abdomen nondistended nontender neurologic Glasgow Coma Score 15 mildly tremulous cranial nerves II through XII intact gait normal Romberg normal pronator drift normal finger to nose normal no asterixis  Doug SouSam Kaycie Pegues, MD 06/25/14 [phone]

## 2014-06-25 NOTE — ED Notes (Signed)
Pt coming from home, reports she tripped over her dog on Wednesday and thinks she hit her head on the dresser. She reports she has been asleep since Wednesday. Woke up this morning feeling dizzy and like her head is "tingling." Reports feeling knots all over her head and a bruise noted to forehead.

## 2014-06-25 NOTE — ED Notes (Signed)
Pt. Provided ice water.

## 2014-06-29 ENCOUNTER — Encounter: Payer: Self-pay | Admitting: Gastroenterology

## 2014-08-16 ENCOUNTER — Other Ambulatory Visit: Payer: Self-pay

## 2014-08-16 DIAGNOSIS — Z1231 Encounter for screening mammogram for malignant neoplasm of breast: Secondary | ICD-10-CM

## 2014-08-31 ENCOUNTER — Ambulatory Visit: Payer: 59

## 2014-09-17 ENCOUNTER — Observation Stay (HOSPITAL_COMMUNITY)
Admission: EM | Admit: 2014-09-17 | Discharge: 2014-09-18 | Disposition: A | Discharge status: Home / Self Care | Payer: 59 | Attending: Internal Medicine | Admitting: Internal Medicine

## 2014-09-17 ENCOUNTER — Encounter (HOSPITAL_COMMUNITY): Payer: Self-pay | Admitting: *Deleted

## 2014-09-17 ENCOUNTER — Emergency Department (HOSPITAL_COMMUNITY): Payer: 59

## 2014-09-17 ENCOUNTER — Inpatient Hospital Stay (HOSPITAL_COMMUNITY): Payer: 59

## 2014-09-17 DIAGNOSIS — Z789 Other specified health status: Secondary | ICD-10-CM | POA: Diagnosis present

## 2014-09-17 DIAGNOSIS — I4581 Long QT syndrome: Secondary | ICD-10-CM | POA: Insufficient documentation

## 2014-09-17 DIAGNOSIS — R4182 Altered mental status, unspecified: Secondary | ICD-10-CM | POA: Diagnosis present

## 2014-09-17 DIAGNOSIS — Z79899 Other long term (current) drug therapy: Secondary | ICD-10-CM | POA: Diagnosis not present

## 2014-09-17 DIAGNOSIS — F909 Attention-deficit hyperactivity disorder, unspecified type: Secondary | ICD-10-CM | POA: Insufficient documentation

## 2014-09-17 DIAGNOSIS — F1090 Alcohol use, unspecified, uncomplicated: Secondary | ICD-10-CM | POA: Diagnosis present

## 2014-09-17 DIAGNOSIS — Z7289 Other problems related to lifestyle: Secondary | ICD-10-CM | POA: Diagnosis present

## 2014-09-17 DIAGNOSIS — F1099 Alcohol use, unspecified with unspecified alcohol-induced disorder: Secondary | ICD-10-CM | POA: Diagnosis not present

## 2014-09-17 DIAGNOSIS — E780 Pure hypercholesterolemia: Secondary | ICD-10-CM | POA: Diagnosis not present

## 2014-09-17 DIAGNOSIS — N179 Acute kidney failure, unspecified: Secondary | ICD-10-CM | POA: Diagnosis not present

## 2014-09-17 DIAGNOSIS — M549 Dorsalgia, unspecified: Secondary | ICD-10-CM | POA: Diagnosis not present

## 2014-09-17 DIAGNOSIS — R410 Disorientation, unspecified: Secondary | ICD-10-CM | POA: Diagnosis not present

## 2014-09-17 DIAGNOSIS — F17210 Nicotine dependence, cigarettes, uncomplicated: Secondary | ICD-10-CM | POA: Insufficient documentation

## 2014-09-17 DIAGNOSIS — Z720 Tobacco use: Secondary | ICD-10-CM | POA: Diagnosis present

## 2014-09-17 DIAGNOSIS — R000 Tachycardia, unspecified: Secondary | ICD-10-CM | POA: Diagnosis not present

## 2014-09-17 DIAGNOSIS — E876 Hypokalemia: Secondary | ICD-10-CM | POA: Diagnosis present

## 2014-09-17 DIAGNOSIS — F419 Anxiety disorder, unspecified: Secondary | ICD-10-CM | POA: Diagnosis not present

## 2014-09-17 DIAGNOSIS — R531 Weakness: Secondary | ICD-10-CM | POA: Diagnosis not present

## 2014-09-17 DIAGNOSIS — F1210 Cannabis abuse, uncomplicated: Secondary | ICD-10-CM | POA: Diagnosis not present

## 2014-09-17 DIAGNOSIS — E860 Dehydration: Secondary | ICD-10-CM | POA: Diagnosis not present

## 2014-09-17 DIAGNOSIS — K219 Gastro-esophageal reflux disease without esophagitis: Secondary | ICD-10-CM | POA: Diagnosis not present

## 2014-09-17 DIAGNOSIS — R4781 Slurred speech: Secondary | ICD-10-CM | POA: Insufficient documentation

## 2014-09-17 DIAGNOSIS — G459 Transient cerebral ischemic attack, unspecified: Secondary | ICD-10-CM | POA: Diagnosis not present

## 2014-09-17 DIAGNOSIS — I959 Hypotension, unspecified: Secondary | ICD-10-CM | POA: Insufficient documentation

## 2014-09-17 DIAGNOSIS — I10 Essential (primary) hypertension: Secondary | ICD-10-CM | POA: Insufficient documentation

## 2014-09-17 DIAGNOSIS — T50905A Adverse effect of unspecified drugs, medicaments and biological substances, initial encounter: Secondary | ICD-10-CM | POA: Diagnosis present

## 2014-09-17 HISTORY — DX: Unspecified osteoarthritis, unspecified site: M19.90

## 2014-09-17 HISTORY — DX: Unspecified convulsions: R56.9

## 2014-09-17 HISTORY — DX: Essential (primary) hypertension: I10

## 2014-09-17 HISTORY — DX: Attention-deficit hyperactivity disorder, unspecified type: F90.9

## 2014-09-17 HISTORY — DX: Other long term (current) drug therapy: Z79.899

## 2014-09-17 LAB — CBC
HCT: 38 % (ref 36.0–46.0)
HEMOGLOBIN: 12.9 g/dL (ref 12.0–15.0)
MCH: 32.8 pg (ref 26.0–34.0)
MCHC: 33.9 g/dL (ref 30.0–36.0)
MCV: 96.7 fL (ref 78.0–100.0)
PLATELETS: 182 10*3/uL (ref 150–400)
RBC: 3.93 MIL/uL (ref 3.87–5.11)
RDW: 12.3 % (ref 11.5–15.5)
WBC: 6.6 10*3/uL (ref 4.0–10.5)

## 2014-09-17 LAB — COMPREHENSIVE METABOLIC PANEL
ALBUMIN: 4.2 g/dL (ref 3.5–5.0)
ALK PHOS: 99 U/L (ref 38–126)
ALT: 35 U/L (ref 14–54)
ANION GAP: 12 (ref 5–15)
AST: 38 U/L (ref 15–41)
BILIRUBIN TOTAL: 0.9 mg/dL (ref 0.3–1.2)
BUN: 59 mg/dL — ABNORMAL HIGH (ref 6–20)
CALCIUM: 9.8 mg/dL (ref 8.9–10.3)
CO2: 25 mmol/L (ref 22–32)
CREATININE: 1.64 mg/dL — AB (ref 0.44–1.00)
Chloride: 103 mmol/L (ref 101–111)
GFR calc Af Amer: 40 mL/min — ABNORMAL LOW (ref >60)
GFR calc non Af Amer: 34 mL/min — ABNORMAL LOW (ref >60)
GLUCOSE: 87 mg/dL (ref 65–99)
Potassium: 3 mmol/L — ABNORMAL LOW (ref 3.5–5.1)
Sodium: 140 mmol/L (ref 135–145)
TOTAL PROTEIN: 7.2 g/dL (ref 6.5–8.1)

## 2014-09-17 LAB — I-STAT TROPONIN, ED: TROPONIN I, POC: 0 ng/mL (ref 0.00–0.08)

## 2014-09-17 LAB — ETHANOL: Alcohol, Ethyl (B): <5 mg/dL (ref <5)

## 2014-09-17 LAB — RAPID URINE DRUG SCREEN, HOSP PERFORMED
Amphetamines: POSITIVE — AB
Barbiturates: NOT DETECTED
Benzodiazepines: POSITIVE — AB
Cocaine: NOT DETECTED
Opiates: NOT DETECTED
Tetrahydrocannabinol: POSITIVE — AB

## 2014-09-17 LAB — MAGNESIUM: MAGNESIUM: 2.3 mg/dL (ref 1.7–2.4)

## 2014-09-17 LAB — I-STAT CG4 LACTIC ACID, ED: Lactic Acid, Venous: 1.43 mmol/L (ref 0.5–2.0)

## 2014-09-17 MED ORDER — POTASSIUM CHLORIDE CRYS ER 20 MEQ PO TBCR
40.0000 meq | EXTENDED_RELEASE_TABLET | Freq: Once | ORAL | Status: AC
  Administered 2014-09-17: 40 meq via ORAL
  Filled 2014-09-17: qty 2

## 2014-09-17 MED ORDER — AMPHETAMINE-DEXTROAMPHETAMINE 10 MG PO TABS
30.0000 mg | ORAL_TABLET | Freq: Four times a day (QID) | ORAL | Status: DC
  Administered 2014-09-17 – 2014-09-18 (×3): 30 mg via ORAL
  Filled 2014-09-17 (×3): qty 3

## 2014-09-17 MED ORDER — ENOXAPARIN SODIUM 40 MG/0.4ML ~~LOC~~ SOLN
40.0000 mg | SUBCUTANEOUS | Status: DC
  Administered 2014-09-17: 40 mg via SUBCUTANEOUS
  Filled 2014-09-17: qty 0.4

## 2014-09-17 MED ORDER — ASPIRIN 300 MG RE SUPP: 300.0000 mg | Freq: Every day | RECTAL | Status: DC

## 2014-09-17 MED ORDER — TRAMADOL HCL 50 MG PO TABS
50.0000 mg | ORAL_TABLET | Freq: Four times a day (QID) | ORAL | Status: DC | PRN
  Administered 2014-09-17 – 2014-09-18 (×3): 50 mg via ORAL
  Filled 2014-09-17 (×3): qty 1

## 2014-09-17 MED ORDER — NICOTINE 21 MG/24HR TD PT24
21.0000 mg | MEDICATED_PATCH | TRANSDERMAL | Status: DC
  Filled 2014-09-17: qty 1

## 2014-09-17 MED ORDER — SODIUM CHLORIDE 0.9 % IV BOLUS (SEPSIS)
1000.0000 mL | Freq: Once | INTRAVENOUS | Status: AC
  Administered 2014-09-17: 1000 mL via INTRAVENOUS

## 2014-09-17 MED ORDER — SODIUM CHLORIDE 0.9 % IV SOLN: INTRAVENOUS | Status: DC

## 2014-09-17 MED ORDER — DIAZEPAM 5 MG PO TABS
10.0000 mg | ORAL_TABLET | Freq: Four times a day (QID) | ORAL | Status: DC | PRN
  Administered 2014-09-17 – 2014-09-18 (×3): 10 mg via ORAL
  Filled 2014-09-17 (×3): qty 2

## 2014-09-17 MED ORDER — ADULT MULTIVITAMIN W/MINERALS CH
1.0000 | ORAL_TABLET | Freq: Every day | ORAL | Status: DC
  Administered 2014-09-17 – 2014-09-18 (×2): 1 via ORAL
  Filled 2014-09-17 (×2): qty 1

## 2014-09-17 MED ORDER — AMLODIPINE BESYLATE 10 MG PO TABS
10.0000 mg | ORAL_TABLET | Freq: Every day | ORAL | Status: DC
  Administered 2014-09-18: 10 mg via ORAL
  Filled 2014-09-17: qty 1

## 2014-09-17 MED ORDER — ACETAMINOPHEN 650 MG RE SUPP: 650.0000 mg | RECTAL | Status: DC | PRN

## 2014-09-17 MED ORDER — ASPIRIN 325 MG PO TABS
325.0000 mg | ORAL_TABLET | Freq: Every day | ORAL | Status: DC
  Administered 2014-09-17 – 2014-09-18 (×2): 325 mg via ORAL
  Filled 2014-09-17 (×2): qty 1

## 2014-09-17 MED ORDER — THIAMINE HCL 100 MG/ML IJ SOLN: 100.0000 mg | Freq: Every day | INTRAMUSCULAR | Status: DC

## 2014-09-17 MED ORDER — VITAMIN B-1 100 MG PO TABS
100.0000 mg | ORAL_TABLET | Freq: Every day | ORAL | Status: DC
  Administered 2014-09-17 – 2014-09-18 (×2): 100 mg via ORAL
  Filled 2014-09-17 (×2): qty 1

## 2014-09-17 MED ORDER — SENNOSIDES-DOCUSATE SODIUM 8.6-50 MG PO TABS: 1.0000 | ORAL_TABLET | Freq: Every evening | ORAL | Status: DC | PRN

## 2014-09-17 MED ORDER — POTASSIUM CHLORIDE 10 MEQ/100ML IV SOLN
10.0000 meq | INTRAVENOUS | Status: AC
  Administered 2014-09-17 (×2): 10 meq via INTRAVENOUS
  Filled 2014-09-17 (×2): qty 100

## 2014-09-17 MED ORDER — STROKE: EARLY STAGES OF RECOVERY BOOK
Freq: Once | Status: AC
  Administered 2014-09-18: 19:00:00
  Filled 2014-09-17: qty 1

## 2014-09-17 MED ORDER — FOLIC ACID 1 MG PO TABS
1.0000 mg | ORAL_TABLET | Freq: Every day | ORAL | Status: DC
  Administered 2014-09-18: 1 mg via ORAL
  Filled 2014-09-17: qty 1

## 2014-09-17 MED ORDER — LORAZEPAM 2 MG/ML IJ SOLN
1.0000 mg | Freq: Once | INTRAMUSCULAR | Status: AC
  Administered 2014-09-17: 1 mg via INTRAVENOUS
  Filled 2014-09-17: qty 1

## 2014-09-17 MED ORDER — ACETAMINOPHEN 325 MG PO TABS: 650.0000 mg | ORAL_TABLET | ORAL | Status: DC | PRN

## 2014-09-17 MED ORDER — NICOTINE 21 MG/24HR TD PT24
21.0000 mg | MEDICATED_PATCH | Freq: Once | TRANSDERMAL | Status: DC
  Administered 2014-09-17: 21 mg via TRANSDERMAL
  Filled 2014-09-17: qty 1

## 2014-09-17 MED ORDER — PNEUMOCOCCAL VAC POLYVALENT 25 MCG/0.5ML IJ INJ
0.5000 mL | INJECTION | INTRAMUSCULAR | Status: DC
  Filled 2014-09-17: qty 0.5

## 2014-09-17 MED ORDER — NICOTINE 21 MG/24HR TD PT24: 21.0000 mg | MEDICATED_PATCH | Freq: Once | TRANSDERMAL | Status: DC

## 2014-09-17 NOTE — Progress Notes (Signed)
Per previous nurse, patient needs MRI done. She has valium prn, but does not want it because she states that she takes it everyday and it is not strong enough for her. She would like something else. Notified Schorr, NP. Patient down to radiology for xray. Will continue to monitor.

## 2014-09-17 NOTE — ED Notes (Signed)
Pt transported to MRI 
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
## 2014-09-17 NOTE — ED Notes (Signed)
Pt unable to void 

## 2014-09-17 NOTE — ED Notes (Signed)
Patient transported to MRI 

## 2014-09-17 NOTE — ED Notes (Signed)
Pt's 4 silver rings and 2 pearl-like earrings given to family.

## 2014-09-17 NOTE — Consult Note (Addendum)
Referring Physician: Hongalgi    Chief Complaint: Confusion, difficulty with speech  HPI: Cristina Myers is an 54 y.o. female who reports that about 3 weeks ago she had a shift change at her job and she now works 8p to 4a.  With the change she has not been sleeping during the day and has had an additional stress of moving as well.  She noted slurred speech about a week ago.  She has also noted that she at times does not know where she is going.  She has been noted to be acting unusually in her car.  She has been noted to drive erratically as well.  Her daughter reports that for the past week if you say something to her she will not remember it 5 minutes later.  BP has been elevated as well.   She was recently started on Chantix but stopped it due to feeling bad on it a week ago and has just recentyl started a new antihypertensive medication.    Date last known well: Date: 09/10/2014 Time last known well: Unable to determine tPA Given: No: Outside time window  Past Medical History  Diagnosis Date  . 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 Left knee pain   . Elevated blood pressure reading without diagnosis of hypertension   . Anxiety state, unspecified   . 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 Narrowing  . Constipation, chronic     history of  . Attention deficit disorder without mention of hyperactivity     meds per psych as of 12/2009  . Pure hypercholesterolemia   . Thumb pain     bilateral  . Family history of breast cancer   . Backache, unspecified     MRI lumbar region, dessicam disc's L3/4, L4/5: 5/S1. 05/2006.  [name] Kitchen Carpal tunnel syndrome     bilateral  . Barrett's esophagus     EGD 03/14/03.  EGD- prox to mid esophagus 10/12/06  . Tobacco use disorder   . GERD (gastroesophageal reflux disease)   . NSVD (normal spontaneous vaginal delivery)     x 4- 81, 83, 91, 98  . Miscarriage     1994    History reviewed. No pertinent past surgical  history.  Family History  Problem Relation Age of Onset  . Hypertension Mother   . Obesity Mother   . Depression Mother   . Colon polyps Mother   . Arthritis Father     crippling rheumatoid  . Drug abuse Sister   . Alcohol abuse Brother   . Cancer Maternal Aunt     breast  . Cancer Maternal Uncle     colon  . Cancer Maternal Grandmother     ovarian  . Diabetes Paternal Grandmother   . Parkinsonism Paternal Grandmother   . Stroke Neg Hx   . Cancer Maternal Aunt     colon   Social History:  reports that she has been smoking Cigarettes.  She has a 40 pack-year smoking history. She has never used smokeless tobacco. She reports that she drinks about 1.0 oz of alcohol per week. She reports that she uses illicit drugs (Marijuana).  Allergies:  Allergies  Allergen Reactions  . Chantix [Varenicline] Other (See Comments)    Mood swings, "more emotional"    Medications:  I have reviewed the patient's current medications. Prior to Admission:  Prescriptions prior to admission  Medication Sig Dispense Refill Last Dose  . albuterol (VENTOLIN  HFA) 108 (90 BASE) MCG/ACT inhaler Two inhalations one minute apart two times a day as needed (Patient taking differently: Inhale 2 puffs into the lungs every 4 (four) hours as needed for wheezing or shortness of breath. ) 1 Inhaler 1 rescue at rescue  . amLODipine (NORVASC) 10 MG tablet Take 10 mg by mouth daily.   09/17/2014 at Unknown time  . amphetamine-dextroamphetamine (ADDERALL) 30 MG tablet Take 30 mg by mouth 4 (four) times daily.    09/17/2014 at Unknown time  . diazepam (VALIUM) 10 MG tablet Take 10 mg by mouth every 6 (six) hours as needed for anxiety.   09/16/2014 at Unknown time  . ibuprofen (ADVIL,MOTRIN) 200 MG tablet Take 200 mg by mouth every 6 (six) hours as needed for mild pain or moderate pain.   Past Month at Unknown time  . traMADol (ULTRAM) 50 MG tablet Take 50 mg by mouth every 6 (six) hours as needed for moderate pain.    09/16/2014 at Unknown time   Scheduled: .  stroke: mapping our early stages of recovery book   Does not apply Once  . [START ON 09/18/2014] amLODipine  10 mg Oral Daily  . amphetamine-dextroamphetamine  30 mg Oral QID  . aspirin  300 mg Rectal Daily   Or  . aspirin  325 mg Oral Daily  . enoxaparin (LOVENOX) injection  40 mg Subcutaneous Q24H  . folic acid  1 mg Oral Daily  . multivitamin with minerals  1 tablet Oral Daily  . [START ON 09/18/2014] nicotine  21 mg Transdermal Q24H  . thiamine  100 mg Oral Daily   Or  . thiamine  100 mg Intravenous Daily  . [DISCONTINUED] nicotine  21 mg Transdermal Once    ROS: History obtained from the patient  General ROS: negative for - chills, fatigue, fever, night sweats, weight gain or weight loss Psychological ROS: as noted in HPI Ophthalmic ROS: negative for - blurry vision, double vision, eye pain or loss of vision ENT ROS: negative for - epistaxis, nasal discharge, oral lesions, sore throat, tinnitus or vertigo Allergy and Immunology ROS: negative for - hives or itchy/watery eyes Hematological and Lymphatic ROS: negative for - bleeding problems, bruising or swollen lymph nodes Endocrine ROS: negative for - galactorrhea, hair pattern changes, polydipsia/polyuria or temperature intolerance Respiratory ROS: negative for - cough, hemoptysis, shortness of breath or wheezing Cardiovascular ROS: negative for - chest pain, dyspnea on exertion, edema or irregular heartbeat Gastrointestinal ROS: negative for - abdominal pain, diarrhea, hematemesis, nausea/vomiting or stool incontinence Genito-Urinary ROS: negative for - dysuria, hematuria, incontinence or urinary frequency/urgency Musculoskeletal ROS: negative for - joint swelling or muscular weakness Neurological ROS: as noted in HPI Dermatological ROS: negative for rash and skin lesion changes  Physical Examination: Blood pressure 149/93, pulse 64, temperature 97.7 F (36.5 C), temperature source  Oral, resp. rate 18, SpO2 95 %.  Gen: NAD HEENT-  Normocephalic, no lesions, without obvious abnormality.  Normal external eye and conjunctiva.  Normal TM's bilaterally.  Normal auditory canals and external ears. Normal external nose, mucus membranes and septum.  Normal pharynx. Cardiovascular- S1, S2 normal, pulses palpable throughout   Lungs- chest clear, no wheezing, rales, normal symmetric air entry Abdomen- soft, non-tender; bowel sounds normal; no masses,  no organomegaly Extremities- no edema Lymph-no adenopathy palpable Musculoskeletal-no joint tenderness, deformity or swelling Skin-warm and dry, no hyperpigmentation, vitiligo, or suspicious lesions  Neurological Examination Mental Status: Fidgety.  Alert, oriented, thought content appropriate.  Speech dysarthric but fluent.  Able to follow 3 step commands without difficulty. Cranial Nerves: II: Discs flat bilaterally; Visual fields grossly normal, pupils equal, round, reactive to light and accommodation III,IV, VI: ptosis not present, extra-ocular motions intact bilaterally V,VII: smile symmetric, facial light touch sensation normal bilaterally VIII: hearing normal bilaterally IX,X: gag reflex present XI: bilateral shoulder shrug XII: midline tongue extension Motor: Right : Upper extremity   5/5    Left:     Upper extremity   5/5  Lower extremity   5/5     Lower extremity   5/5 Tone and bulk:normal tone throughout; no atrophy noted Sensory: Pinprick and light touch intact throughout, bilaterally Deep Tendon Reflexes: 2+ and symmetric throughout Plantars: Right: downgoing   Left: downgoing Cerebellar: normal finger-to-nose and normal heel-to-shin testing bilaterally   Laboratory Studies:  Basic Metabolic Panel:  Recent Labs Lab 09/17/14 1215 09/17/14 1257  NA 140  --   K 3.0*  --   CL 103  --   CO2 25  --   GLUCOSE 87  --   BUN 59*  --   CREATININE 1.64*  --   CALCIUM 9.8  --   MG  --  2.3    Liver  Function Tests:  Recent Labs Lab 09/17/14 1215  AST 38  ALT 35  ALKPHOS 99  BILITOT 0.9  PROT 7.2  ALBUMIN 4.2   No results for input(s): LIPASE, AMYLASE in the last 168 hours. No results for input(s): AMMONIA in the last 168 hours.  CBC:  Recent Labs Lab 09/17/14 1215  WBC 6.6  HGB 12.9  HCT 38.0  MCV 96.7  PLT 182    Cardiac Enzymes: No results for input(s): CKTOTAL, CKMB, CKMBINDEX, TROPONINI in the last 168 hours.  BNP: Invalid input(s): POCBNP  CBG: No results for input(s): GLUCAP in the last 168 hours.  Microbiology: No results found for this or any previous visit.  Coagulation Studies: No results for input(s): LABPROT, INR in the last 72 hours.  Urinalysis: No results for input(s): COLORURINE, LABSPEC, PHURINE, GLUCOSEU, HGBUR, BILIRUBINUR, KETONESUR, PROTEINUR, UROBILINOGEN, NITRITE, LEUKOCYTESUR in the last 168 hours.  Invalid input(s): APPERANCEUR  Lipid Panel: No results found for: CHOL, TRIG, HDL, CHOLHDL, VLDL, LDLCALC  HgbA1C: No results found for: HGBA1C  Urine Drug Screen:     Component Value Date/Time   LABOPIA NONE DETECTED 09/17/2014 1536   COCAINSCRNUR NONE DETECTED 09/17/2014 1536   LABBENZ POSITIVE* 09/17/2014 1536   AMPHETMU POSITIVE* 09/17/2014 1536   THCU POSITIVE* 09/17/2014 1536   LABBARB NONE DETECTED 09/17/2014 1536    Alcohol Level:  Recent Labs Lab 09/17/14 1257  ETH <5    Other results: EKG: sinus tachycardia at 103 bpm.  Imaging: Ct Head Wo Contrast  09/17/2014   CLINICAL DATA:  Altered mental status. Fatigue and confusion for the last few days.  EXAM: CT HEAD WITHOUT CONTRAST  TECHNIQUE: Contiguous axial images were obtained from the base of the skull through the vertex without intravenous contrast.  COMPARISON:  06/25/2014.  FINDINGS: No mass lesion, mass effect, midline shift, hydrocephalus, hemorrhage. No territorial ischemia or acute infarction. Calvarium intact. Visible paranasal sinuses are normal.   IMPRESSION: Negative CT head.  No interval change.   Electronically Signed   By: Andreas Newport M.D.   On: 09/17/2014 13:23    Assessment: 54 y.o. female presenting with mental status changes.  Although these may be related to sleep derivation will need to rule out other possible etiologies as well.  Head CT independently  reviewed and shows no acute changes.  Chantix discontinued.  Renal function elevated and can not rule out a metabolic contributor as well.  Although no focality on examination, patient fidgety, anxious and tachycardic.  Unclear if any other ingestions are playing a factor.  Stroke Risk Factors - hyperlipidemia, hypertension and smoking  Plan: 1. D/C Ultram 2. MRI, MRA  of the brain without contrast 3. EEG 4. Frequent neuro checks 5. Agree with addressing renal function   Thana Farr, MD [facility] [phone] 09/17/2014, 6:43 PM

## 2014-09-17 NOTE — ED Notes (Signed)
When pt arrived in MRI she notified staff that there was no way she was going to let them put her in the MRI machine without sedating her completely.  Dr Waymon Amato paged and responded.  He stated he will let Neurology assess her to decide the next pathway.
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
## 2014-09-18 ENCOUNTER — Ambulatory Visit (INDEPENDENT_AMBULATORY_CARE_PROVIDER_SITE_OTHER): Payer: 59

## 2014-09-18 ENCOUNTER — Inpatient Hospital Stay (HOSPITAL_COMMUNITY): Payer: 59

## 2014-09-18 ENCOUNTER — Ambulatory Visit (HOSPITAL_COMMUNITY): Payer: 59

## 2014-09-18 DIAGNOSIS — F1210 Cannabis abuse, uncomplicated: Secondary | ICD-10-CM | POA: Diagnosis not present

## 2014-09-18 DIAGNOSIS — Z720 Tobacco use: Secondary | ICD-10-CM | POA: Diagnosis not present

## 2014-09-18 DIAGNOSIS — T50905A Adverse effect of unspecified drugs, medicaments and biological substances, initial encounter: Secondary | ICD-10-CM | POA: Diagnosis present

## 2014-09-18 DIAGNOSIS — R4182 Altered mental status, unspecified: Secondary | ICD-10-CM | POA: Diagnosis not present

## 2014-09-18 DIAGNOSIS — N179 Acute kidney failure, unspecified: Secondary | ICD-10-CM | POA: Diagnosis not present

## 2014-09-18 DIAGNOSIS — G459 Transient cerebral ischemic attack, unspecified: Secondary | ICD-10-CM

## 2014-09-18 LAB — BASIC METABOLIC PANEL
Anion gap: 5 (ref 5–15)
BUN: 34 mg/dL — AB (ref 6–20)
CALCIUM: 8.9 mg/dL (ref 8.9–10.3)
CO2: 23 mmol/L (ref 22–32)
Chloride: 112 mmol/L — ABNORMAL HIGH (ref 101–111)
Creatinine, Ser: 0.94 mg/dL (ref 0.44–1.00)
GFR calc Af Amer: >60 mL/min (ref >60)
GLUCOSE: 88 mg/dL (ref 65–99)
Potassium: 3.9 mmol/L (ref 3.5–5.1)
SODIUM: 140 mmol/L (ref 135–145)

## 2014-09-18 LAB — LIPID PANEL
Cholesterol: 178 mg/dL (ref 0–200)
HDL: 39 mg/dL — AB (ref >40)
LDL CALC: 108 mg/dL — AB (ref 0–99)
TRIGLYCERIDES: 155 mg/dL — AB (ref <150)
Total CHOL/HDL Ratio: 4.6 RATIO
VLDL: 31 mg/dL (ref 0–40)

## 2014-09-18 LAB — CBC
HCT: 32.6 % — ABNORMAL LOW (ref 36.0–46.0)
Hemoglobin: 11 g/dL — ABNORMAL LOW (ref 12.0–15.0)
MCH: 33.3 pg (ref 26.0–34.0)
MCHC: 33.7 g/dL (ref 30.0–36.0)
MCV: 98.8 fL (ref 78.0–100.0)
PLATELETS: 148 10*3/uL — AB (ref 150–400)
RBC: 3.3 MIL/uL — ABNORMAL LOW (ref 3.87–5.11)
RDW: 12.3 % (ref 11.5–15.5)
WBC: 4.2 10*3/uL (ref 4.0–10.5)

## 2014-09-18 MED ORDER — FOLIC ACID 1 MG PO TABS: 1.0000 mg | ORAL_TABLET | Freq: Every day | ORAL | Status: DC

## 2014-09-18 MED ORDER — HYDROCODONE-ACETAMINOPHEN 10-325 MG PO TABS: 1.0000 | ORAL_TABLET | Freq: Four times a day (QID) | ORAL | Status: DC | PRN

## 2014-09-18 MED ORDER — ALBUTEROL SULFATE HFA 108 (90 BASE) MCG/ACT IN AERS: 2.0000 | INHALATION_SPRAY | RESPIRATORY_TRACT | Status: DC | PRN

## 2014-09-18 MED ORDER — HYDROCODONE-ACETAMINOPHEN 10-325 MG PO TABS
1.0000 | ORAL_TABLET | Freq: Four times a day (QID) | ORAL | Status: DC | PRN
  Administered 2014-09-18: 1 via ORAL
  Filled 2014-09-18: qty 1

## 2014-09-18 MED ORDER — THIAMINE HCL 100 MG PO TABS: 100.0000 mg | ORAL_TABLET | Freq: Every day | ORAL | Status: DC

## 2014-09-18 MED ORDER — NICOTINE 21 MG/24HR TD PT24: 21.0000 mg | MEDICATED_PATCH | TRANSDERMAL | Status: DC

## 2014-09-18 NOTE — Care Management Note (Signed)
Case Management Note  Patient Details  Name: Kilani Joffe MRN: [id number] Date of Birth: Jul 17, 1960  Subjective/Objective:   Patient is from home, pta indep.  Patient has transportation at [employer] , has insurance.  NCM will cont to follow for dc needs.                 Action/Plan:   Expected Discharge Date:                  Expected Discharge Plan:  Home/Self Care  In-House Referral:     Discharge planning Services  CM Consult  Post Acute Care Choice:    Choice offered to:     DME Arranged:    DME Agency:     HH Arranged:    HH Agency:     Status of Service:  Completed, signed off  Medicare Important Message Given:    Date Medicare IM Given:    Medicare IM give by:    Date Additional Medicare IM Given:    Additional Medicare Important Message give by:     If discussed at Long Length of Stay Meetings, dates discussed:    Additional Comments:  Leone Haven, RN 09/18/2014, 11:44 AM

## 2014-09-18 NOTE — Progress Notes (Signed)
EEG completed; results pending.    

## 2014-09-18 NOTE — Progress Notes (Signed)
Subjective: Patient awake and alert.  Does not feel that she is having any further confusion.  Does complain of neck pain.    Objective: Current vital signs: BP 131/80 mmHg  Pulse 75  Temp(Src) 97.5 F (36.4 C) (Oral)  Resp 18  SpO2 98% Vital signs in last 24 hours: Temp:  [97.5 F (36.4 C)-98.3 F (36.8 C)] 97.5 F (36.4 C) (08/16 0926) Pulse Rate:  [64-105] 75 (08/16 0926) Resp:  [14-26] 18 (08/16 0926) BP: (74-157)/(48-98) 131/80 mmHg (08/16 0926) SpO2:  [93 %-100 %] 98 % (08/16 0926)  Intake/Output from previous day: 08/15 0701 - 08/16 0700 In: -  Out: 11 [Urine:11] Intake/Output this shift:   Nutritional status: Diet Heart Room service appropriate?: Yes; Fluid consistency:: Thin  Neurologic Exam: Mental Status: Alert, oriented, thought content appropriate. Speech dysarthric but fluent. Able to follow 3 step commands without difficulty. Cranial Nerves: II: Discs flat bilaterally; Visual fields grossly normal, pupils equal, round, reactive to light and accommodation III,IV, VI: ptosis not present, extra-ocular motions intact bilaterally V,VII: smile symmetric, facial light touch sensation normal bilaterally VIII: hearing normal bilaterally IX,X: gag reflex present XI: bilateral shoulder shrug XII: midline tongue extension Motor: Right :Upper extremity 5/5Left: Upper extremity 5/5 Lower extremity 5/5Lower extremity 5/5 Tone and bulk:normal tone throughout; no atrophy noted Sensory: Pinprick and light touch intact throughout, bilaterally Deep Tendon Reflexes: 2+ and symmetric throughout Plantars: Right: downgoingLeft: downgoing Cerebellar: normal finger-to-nose and normal heel-to-shin testing bilaterally  Lab Results: Basic Metabolic Panel:  Recent Labs Lab 09/17/14 1215 09/17/14 1257 09/18/14 0705  NA 140  --   140  K 3.0*  --  3.9  CL 103  --  112*  CO2 25  --  23  GLUCOSE 87  --  88  BUN 59*  --  34*  CREATININE 1.64*  --  0.94  CALCIUM 9.8  --  8.9  MG  --  2.3  --     Liver Function Tests:  Recent Labs Lab 09/17/14 1215  AST 38  ALT 35  ALKPHOS 99  BILITOT 0.9  PROT 7.2  ALBUMIN 4.2   No results for input(s): LIPASE, AMYLASE in the last 168 hours. No results for input(s): AMMONIA in the last 168 hours.  CBC:  Recent Labs Lab 09/17/14 1215 09/18/14 0705  WBC 6.6 4.2  HGB 12.9 11.0*  HCT 38.0 32.6*  MCV 96.7 98.8  PLT 182 148*    Cardiac Enzymes: No results for input(s): CKTOTAL, CKMB, CKMBINDEX, TROPONINI in the last 168 hours.  Lipid Panel: No results for input(s): CHOL, TRIG, HDL, CHOLHDL, VLDL, LDLCALC in the last 168 hours.  CBG: No results for input(s): GLUCAP in the last 168 hours.  Microbiology: No results found for this or any previous visit.  Coagulation Studies: No results for input(s): LABPROT, INR in the last 72 hours.  Imaging: Dg Chest 2 View  09/17/2014   CLINICAL DATA:  TIA  EXAM: CHEST  2 VIEW  COMPARISON:  12/13/2012  FINDINGS: Lungs are clear.  No pleural effusion or pneumothorax.  The heart is normal in size.  Visualized osseous structures are within normal limits.  IMPRESSION: Normal chest radiographs.   Electronically Signed   By: Charline Bills M.D.   On: 09/17/2014 19:42   Ct Head Wo Contrast  09/17/2014   CLINICAL DATA:  Altered mental status. Fatigue and confusion for the last few days.  EXAM: CT HEAD WITHOUT CONTRAST  TECHNIQUE: Contiguous axial images were obtained from the base  of the skull through the vertex without intravenous contrast.  COMPARISON:  06/25/2014.  FINDINGS: No mass lesion, mass effect, midline shift, hydrocephalus, hemorrhage. No territorial ischemia or acute infarction. Calvarium intact. Visible paranasal sinuses are normal.  IMPRESSION: Negative CT head.  No interval change.   Electronically Signed   By:  Andreas Newport M.D.   On: 09/17/2014 13:23   Mr Brain Wo Contrast  09/18/2014   CLINICAL DATA:  Several day history of confusion, generalized weakness, slightly slurred speech. History of seizure, hypertension, hypercholesterolemia.  EXAM: MRI HEAD WITHOUT CONTRAST  MRA HEAD WITHOUT CONTRAST  TECHNIQUE: Multiplanar, multiecho pulse sequences of the brain and surrounding structures were obtained without intravenous contrast. Angiographic images of the head were obtained using MRA technique without contrast.  COMPARISON:  CT head September 17, 2014 at 1316 hours  FINDINGS: MRI HEAD FINDINGS  The ventricles and sulci are normal for patient's age. No suspicious parenchymal signal, mass lesions, mass effect. A few subcentimeter supratentorial white matter T2 hyperintensities are within normal range for patient's age and though nonspecific, most often associated with chronic small vessel ischemic disease. No reduced diffusion to suggest acute ischemia. No susceptibility artifact to suggest hemorrhage.  No abnormal extra-axial fluid collections. No extra-axial masses though, contrast enhanced sequences would be more sensitive. Normal major intracranial vascular flow voids seen at the skull base. Symmetric size, morphology and signal characteristics of the hippocampi.  Ocular globes and orbital contents are unremarkable though not tailored for evaluation. No abnormal sellar expansion. Moderate LEFT maxillary sinus mucosal thickening with air-fluid level. Mild LEFT ethmoid mucosal thickening. No suspicious calvarial bone marrow signal. No abnormal sellar expansion. Craniocervical junction maintained.  MRA HEAD FINDINGS  Anterior circulation: Normal flow related enhancement of the included cervical, petrous, cavernous and supra clinoid internal carotid arteries. Mild symmetric loss of signal within the anterior genu of the carotid siphons most consistent with artifact due to tortuosity, less likely stenosis. Patent anterior  communicating artery. Normal flow related enhancement of the anterior and middle cerebral arteries, including more distal segments.  No large vessel occlusion, high-grade stenosis, abnormal luminal irregularity, aneurysm.  Posterior circulation: Codominant vertebral arteries. Basilar artery is patent, with normal flow related enhancement of the main branch vessels. Normal flow related enhancement of the posterior cerebral arteries.  No large vessel occlusion, high-grade stenosis, abnormal luminal irregularity, aneurysm.  IMPRESSION: Normal noncontrast MRI of the brain for age.  Negative MRA head.   Electronically Signed   By: Awilda Metro M.D.   On: 09/18/2014 00:02   Mr Maxine Glenn Head/brain Wo Cm  09/18/2014   CLINICAL DATA:  Several day history of confusion, generalized weakness, slightly slurred speech. History of seizure, hypertension, hypercholesterolemia.  EXAM: MRI HEAD WITHOUT CONTRAST  MRA HEAD WITHOUT CONTRAST  TECHNIQUE: Multiplanar, multiecho pulse sequences of the brain and surrounding structures were obtained without intravenous contrast. Angiographic images of the head were obtained using MRA technique without contrast.  COMPARISON:  CT head September 17, 2014 at 1316 hours  FINDINGS: MRI HEAD FINDINGS  The ventricles and sulci are normal for patient's age. No suspicious parenchymal signal, mass lesions, mass effect. A few subcentimeter supratentorial white matter T2 hyperintensities are within normal range for patient's age and though nonspecific, most often associated with chronic small vessel ischemic disease. No reduced diffusion to suggest acute ischemia. No susceptibility artifact to suggest hemorrhage.  No abnormal extra-axial fluid collections. No extra-axial masses though, contrast enhanced sequences would be more sensitive. Normal major intracranial vascular flow voids seen  at the skull base. Symmetric size, morphology and signal characteristics of the hippocampi.  Ocular globes and orbital  contents are unremarkable though not tailored for evaluation. No abnormal sellar expansion. Moderate LEFT maxillary sinus mucosal thickening with air-fluid level. Mild LEFT ethmoid mucosal thickening. No suspicious calvarial bone marrow signal. No abnormal sellar expansion. Craniocervical junction maintained.  MRA HEAD FINDINGS  Anterior circulation: Normal flow related enhancement of the included cervical, petrous, cavernous and supra clinoid internal carotid arteries. Mild symmetric loss of signal within the anterior genu of the carotid siphons most consistent with artifact due to tortuosity, less likely stenosis. Patent anterior communicating artery. Normal flow related enhancement of the anterior and middle cerebral arteries, including more distal segments.  No large vessel occlusion, high-grade stenosis, abnormal luminal irregularity, aneurysm.  Posterior circulation: Codominant vertebral arteries. Basilar artery is patent, with normal flow related enhancement of the main branch vessels. Normal flow related enhancement of the posterior cerebral arteries.  No large vessel occlusion, high-grade stenosis, abnormal luminal irregularity, aneurysm.  IMPRESSION: Normal noncontrast MRI of the brain for age.  Negative MRA head.   Electronically Signed   By: Awilda Metro M.D.   On: 09/18/2014 00:02    Medications:  I have reviewed the patient's current medications. Scheduled: .  stroke: mapping our early stages of recovery book   Does not apply Once  . amLODipine  10 mg Oral Daily  . amphetamine-dextroamphetamine  30 mg Oral QID  . aspirin  300 mg Rectal Daily   Or  . aspirin  325 mg Oral Daily  . enoxaparin (LOVENOX) injection  40 mg Subcutaneous Q24H  . folic acid  1 mg Oral Daily  . multivitamin with minerals  1 tablet Oral Daily  . nicotine  21 mg Transdermal Q24H  . pneumococcal 23 valent vaccine  0.5 mL Intramuscular Tomorrow-1000  . thiamine  100 mg Oral Daily   Or  . thiamine  100 mg  Intravenous Daily  . [DISCONTINUED] nicotine  21 mg Transdermal Once    Assessment/Plan: Patient less anxious this morning. MRI of the brain and MRA independently reviewed and shows no abnormalities.  EEG pending.  Renal function improved.  Recommendations: 1.  Will follow up results of EEG.   LOS: 1 day   Thana Farr, MD [facility] [phone] 09/18/2014  9:42 AM

## 2014-09-18 NOTE — Progress Notes (Signed)
PT Cancellation Note  Patient Details Name: Cristina Myers MRN: [id number] DOB: Jul 05, 1960   Cancelled Treatment:    Reason Eval/Treat Not Completed: PT screened, no needs identified, will sign off. Nsg reports pt mobilizing without difficulty.   Raahil Ong 09/18/2014, 10:02 AM  Skip Mayer PT [phone]
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
## 2014-09-18 NOTE — Procedures (Signed)
ELECTROENCEPHALOGRAM REPORT   Patient: Cristina Myers      Room #: 4U-98 Age: 54 y.o.        Sex: female Referring Physician: Dr Benjamine Mola Report Date:  09/18/2014        Interpreting Physician: Omelia Blackwater  History: Cristina Myers is an 54 y.o. female admitted with transient speech and confusion episodes  Medications:  Scheduled: .  stroke: mapping our early stages of recovery book   Does not apply Once  . amLODipine  10 mg Oral Daily  . amphetamine-dextroamphetamine  30 mg Oral QID  . aspirin  300 mg Rectal Daily   Or  . aspirin  325 mg Oral Daily  . enoxaparin (LOVENOX) injection  40 mg Subcutaneous Q24H  . folic acid  1 mg Oral Daily  . multivitamin with minerals  1 tablet Oral Daily  . nicotine  21 mg Transdermal Q24H  . pneumococcal 23 valent vaccine  0.5 mL Intramuscular Tomorrow-1000  . thiamine  100 mg Oral Daily  . [DISCONTINUED] nicotine  21 mg Transdermal Once    Conditions of Recording:  This is a 19 channel EEG carried out with the patient in the awake state.  Description:  The waking background activity consists of a low voltage, symmetrical, fairly well organized, 9-10 Hz alpha activity, seen from the parieto-occipital and posterior temporal regions. No focal slowing or epileptiform activity is noted.   The patient drowses with slowing to irregular, low voltage theta and beta activity. Normal sleep architecture is not observed. Hyperventilation produced a mild to moderate buildup but failed to elicit any abnormalities. Intermittent photic stimulation was performed and elicits a symmetrical driving response but fails to elicit any abnormalities.  IMPRESSION: Normal electroencephalogram. There are no focal lateralizing or epileptiform features.   Elspeth Cho, DO [facility] [phone]  If 7pm- 7am, please page neurology on call as listed in AMION. 09/18/2014, 2:52 PM

## 2014-09-18 NOTE — Progress Notes (Signed)
Patient given discharge instruction and handout.  Patient verbalized discharge instructions.  Patient discharge vitals  Filed Vitals:   09/18/14 1210  BP: 168/109  Pulse: 79  Temp: 98.1 F (36.7 C)  Resp: 19   Patient discharge medications   Medication List    STOP taking these medications        ibuprofen 200 MG tablet  Commonly known as:  ADVIL,MOTRIN     traMADol 50 MG tablet  Commonly known as:  ULTRAM      TAKE these medications        albuterol 108 (90 BASE) MCG/ACT inhaler  Commonly known as:  VENTOLIN HFA  Inhale 2 puffs into the lungs every 4 (four) hours as needed for wheezing or shortness of breath.     amLODipine 10 MG tablet  Commonly known as:  NORVASC  Take 10 mg by mouth daily.     amphetamine-dextroamphetamine 30 MG tablet  Commonly known as:  ADDERALL  Take 30 mg by mouth 4 (four) times daily.     diazepam 10 MG tablet  Commonly known as:  VALIUM  Take 10 mg by mouth every 6 (six) hours as needed for anxiety.     folic acid 1 MG tablet  Commonly known as:  FOLVITE  Take 1 tablet (1 mg total) by mouth daily.     HYDROcodone-acetaminophen 10-325 MG per tablet  Commonly known as:  NORCO  Take 1 tablet by mouth every 6 (six) hours as needed for moderate pain.     nicotine 21 mg/24hr patch  Commonly known as:  NICODERM CQ - dosed in mg/24 hours  Place 1 patch (21 mg total) onto the skin daily.     thiamine 100 MG tablet  Take 1 tablet (100 mg total) by mouth daily.       Patient escorted to car. Danne Harbor, RN at [phone] 09/18/14

## 2014-09-18 NOTE — Evaluation (Signed)
SLP Cancellation Note  Patient Details Name: Blossie Raffel MRN: [id number] DOB: 12-25-1960   Cancelled treatment:       Reason Eval/Treat Not Completed: Other (comment) (pt currently very frustrated and reports awaiting nicotine patch and other medications.  advised would return at later time due to pt's current agitation, she was agreeable.)   Donavan Burnet, MS [facility] SLP [phone]

## 2014-09-18 NOTE — Progress Notes (Signed)
Unable to assess pt, pt in procedure care  assumed from [place]. Pt returned to unit and was discharged.

## 2014-09-18 NOTE — Progress Notes (Signed)
Assumed care of pt from [place]. Pt off of floor in test.

## 2014-09-19 LAB — HEMOGLOBIN A1C
Hgb A1c MFr Bld: 5.6 % (ref 4.8–5.6)
Mean Plasma Glucose: 114 mg/dL

## 2014-09-28 ENCOUNTER — Ambulatory Visit (INDEPENDENT_AMBULATORY_CARE_PROVIDER_SITE_OTHER): Payer: 59 | Admitting: Neurology

## 2014-09-28 ENCOUNTER — Encounter: Payer: Self-pay | Admitting: Neurology

## 2014-09-28 VITALS — BP 158/100 | HR 82 | Ht 62.0 in | Wt 126.6 lb

## 2014-09-28 DIAGNOSIS — I10 Essential (primary) hypertension: Secondary | ICD-10-CM

## 2014-09-28 DIAGNOSIS — F419 Anxiety disorder, unspecified: Secondary | ICD-10-CM | POA: Diagnosis not present

## 2014-09-28 DIAGNOSIS — F909 Attention-deficit hyperactivity disorder, unspecified type: Secondary | ICD-10-CM | POA: Insufficient documentation

## 2014-09-28 DIAGNOSIS — F17200 Nicotine dependence, unspecified, uncomplicated: Secondary | ICD-10-CM

## 2014-09-28 DIAGNOSIS — F1210 Cannabis abuse, uncomplicated: Secondary | ICD-10-CM | POA: Diagnosis not present

## 2014-09-28 DIAGNOSIS — Z720 Tobacco use: Secondary | ICD-10-CM

## 2014-09-28 DIAGNOSIS — N179 Acute kidney failure, unspecified: Secondary | ICD-10-CM

## 2014-09-28 NOTE — Patient Instructions (Signed)
-   your condition has low suspicious for stroke, seizure, but most likely anxiety related.  - lower stress levels and cope with anxiety.  - recommend psychologist follow up for counseling - ok with valium PRN for anxiety - quit smoking - Follow up with your primary care physician for stroke risk factor modification. Recommend maintain blood pressure goal <140/80, diabetes with hemoglobin A1c goal below 6.5% and lipids with LDL cholesterol goal below 70 mg/dL.  - OK to take AECI as kidney function back to normal.  - keep hydrated and avoid dehydration - OK to go back to work on 10/11/14. Since you are not tolerating night shift, recommend day shift only.  - follow up as needed
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
## 2016-02-12 ENCOUNTER — Encounter (HOSPITAL_COMMUNITY): Payer: Self-pay | Admitting: *Deleted

## 2016-02-12 ENCOUNTER — Ambulatory Visit (HOSPITAL_COMMUNITY)
Admission: EM | Admit: 2016-02-12 | Discharge: 2016-02-12 | Disposition: A | Discharge status: Home / Self Care | Payer: 59 | Attending: Internal Medicine | Admitting: Internal Medicine

## 2016-02-12 DIAGNOSIS — M5416 Radiculopathy, lumbar region: Secondary | ICD-10-CM | POA: Diagnosis not present

## 2016-02-12 MED ORDER — DICLOFENAC SODIUM 75 MG PO TBEC
75.0000 mg | DELAYED_RELEASE_TABLET | Freq: Two times a day (BID) | ORAL | 0 refills | Status: DC
Start: 2016-02-12 — End: 2017-05-03

## 2016-02-12 MED ORDER — METHOCARBAMOL 500 MG PO TABS: 500.0000 mg | ORAL_TABLET | Freq: Two times a day (BID) | ORAL | 0 refills | Status: DC

## 2016-02-12 NOTE — ED Triage Notes (Signed)
Pt  Reports  Back  Pain l  Side  Radiating  Down for  sev  Months          Worse  Over    Last  sev  Weeks    denys  A  ny   specefic     Injury  Burt  Lifts  A  Lot  At  Work   Pain is  Worse  On  Movement  Treated  For  For  Tick  Bite     approx  1  Mont  Was   Given       Injection  And  rx     But  Did  Not   Take   Al l  The  Pills    denys  Any     Urinary   Symptoms

## 2016-02-12 NOTE — ED Provider Notes (Signed)
CSN: [id number]     Arrival date & time 02/12/16  1436 History   None    Chief Complaint  Patient presents with  . Back Pain   (Consider location/radiation/quality/duration/timing/severity/associated sxs/prior Treatment) 56 year old female presents with chief complaint of lower back pain. States she has had her pain for several months but that it has worsened over previous 2-3 days. Her pain is described as sharp and burning, radiating down her left leg. Pain is worse with movement and when she lays on her back. She has no loss of sensation in her extremities.   The history is provided by the patient.  Back Pain    Past Medical History:  Diagnosis Date  . ADHD (attention deficit hyperactivity disorder)    Hattie Perch/notes 09/17/2014  . Anxiety state, unspecified   . Arthritis    "hands, back" (09/17/2014)  . Attention deficit disorder without mention of hyperactivity    meds per psych as of 12/2009  . Backache, unspecified    MRI lumbar region, dessicam disc's L3/4, L4/5: 5/S1. 05/2006.  . Barrett's esophagus    EGD 03/14/03.  EGD- prox to mid esophagus 10/12/06  . Carpal tunnel syndrome    bilateral  . Cervicalgia    with degenerative changes from C3- C7, MRI 05/2006. Narrowing  . Chronic prescription benzodiazepine use    Hattie Perch/notes 09/17/2014  . Constipation, chronic    history of  . Elevated blood pressure reading without diagnosis of hypertension   . Family history of breast cancer   . GERD (gastroesophageal reflux disease)   . Hypertension   . Left knee pain   . Miscarriage    1994  . NSVD (normal spontaneous vaginal delivery)    x 4- 81, 83, 91, 98  . 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 Pure hypercholesterolemia   . Seizures (HCC)    "when I was real young; never medicated for them; went away"  . Thumb pain    bilateral  . Tobacco use disorder    Past Surgical History:  Procedure Laterality Date  . NO PAST SURGERIES     Family History  Problem Relation Age of Onset    . Hypertension Mother   . Obesity Mother   . Depression Mother   . Colon polyps Mother   . Arthritis Father     crippling rheumatoid  . Drug abuse Sister   . Alcohol abuse Brother   . Cancer Maternal Aunt     breast  . Cancer Maternal Uncle     colon  . Cancer Maternal Grandmother     ovarian  . Diabetes Paternal Grandmother   . Parkinsonism Paternal Grandmother   . Cancer Maternal Aunt     colon  . Stroke Neg Hx    Social History  Substance Use Topics  . Smoking status: Current Every Day Smoker    Packs/day: 1.00    Years: 46.00    Types: Cigarettes  . Smokeless tobacco: Never Used  . Alcohol use 1.0 oz/week    2 Standard drinks or equivalent per week     Comment: 09/17/2014 "peach bacardi; couple shots/drink; couple drinks twice/month"   OB History    No data available     Review of Systems  Reason unable to perform ROS: as covered in HPI.  Musculoskeletal: Positive for back pain.  All other systems reviewed and are negative.   Allergies  Chantix [varenicline]  Home Medications   Prior to Admission medications  Medication Sig Start Date End Date Taking? Authorizing Provider  albuterol (VENTOLIN HFA) 108 (90 BASE) MCG/ACT inhaler Inhale 2 puffs into the lungs every 4 (four) hours as needed for wheezing or shortness of breath. 09/18/14   Joseph Art, DO  amphetamine-dextroamphetamine (ADDERALL) 30 MG tablet Take 30 mg by mouth 4 (four) times daily.     Historical Provider, MD  Azilsartan Medoxomil (EDARBI) 40 MG TABS Take by mouth.    Historical Provider, MD  diazepam (VALIUM) 10 MG tablet Take 10 mg by mouth every 6 (six) hours as needed for anxiety.    Historical Provider, MD  diclofenac (VOLTAREN) 75 MG EC tablet Take 1 tablet (75 mg total) by mouth 2 (two) times daily. 02/12/16   Dorena Bodo, NP  folic acid (FOLVITE) 1 MG tablet Take 1 tablet (1 mg total) by mouth daily. 09/18/14   Joseph Art, DO  HYDROcodone-acetaminophen (NORCO) 10-325 MG per  tablet Take 1 tablet by mouth every 6 (six) hours as needed for moderate pain. 09/18/14   Joseph Art, DO  methocarbamol (ROBAXIN) 500 MG tablet Take 1 tablet (500 mg total) by mouth 2 (two) times daily. 02/12/16   Dorena Bodo, NP  nicotine (NICODERM CQ - DOSED IN MG/24 HOURS) 21 mg/24hr patch Place 1 patch (21 mg total) onto the skin daily. 09/18/14   Joseph Art, DO   Meds Ordered and Administered this Visit  Medications - No data to display  BP 130/58 (BP Location: Right Arm)   Pulse 78   Temp 98.6 F (37 C) (Oral)  No data found.   Physical Exam  Constitutional: She is oriented to person, place, and time. She appears well-developed and well-nourished. No distress.  Cardiovascular: Normal rate and regular rhythm.   Pulmonary/Chest: Effort normal and breath sounds normal.  Abdominal: Soft. Bowel sounds are normal.  Musculoskeletal:       Lumbar back: She exhibits pain. She exhibits normal range of motion, no tenderness, no bony tenderness, no swelling, no edema, no deformity, no laceration and no spasm.       Back:  Neurological: She is alert and oriented to person, place, and time.  Skin: Skin is warm. Capillary refill takes less than 2 seconds. She is not diaphoretic.  Psychiatric: She has a normal mood and affect.  Nursing note and vitals reviewed.   Urgent Care Course   Clinical Course     Procedures (including critical care time)  Labs Review Labs Reviewed - No data to display  Imaging Review No results found.   Visual Acuity Review  Right Eye Distance:   Left Eye Distance:   Bilateral Distance:    Right Eye Near:   Left Eye Near:    Bilateral Near:         MDM   1. Lumbar radiculopathy   I am treating your for lower back pain and lumbar radiculopathy. I have prescribed Robaxin as a muscle relaxant and diclofenac for pain twice a day. I recommend you follow up with your primary care provider for further evaluation and management of your pain  symptoms.      Dorena Bodo, NP 02/12/16 1651

## 2016-02-12 NOTE — Discharge Instructions (Signed)
I am treating your for lower back pain and lumbar radiculopathy. I have prescribed Robaxin as a muscle relaxant and diclofenac for pain twice a day. I recommend you follow up with your primary care provider for further evaluation and management of your pain symptoms.

## 2016-08-14 ENCOUNTER — Encounter: Payer: Self-pay | Admitting: Emergency Medicine

## 2016-08-14 ENCOUNTER — Emergency Department
Admission: EM | Admit: 2016-08-14 | Discharge: 2016-08-14 | Disposition: A | Discharge status: Home / Self Care | Payer: 59 | Attending: Emergency Medicine | Admitting: Emergency Medicine

## 2016-08-14 DIAGNOSIS — F17210 Nicotine dependence, cigarettes, uncomplicated: Secondary | ICD-10-CM | POA: Insufficient documentation

## 2016-08-14 DIAGNOSIS — G8929 Other chronic pain: Secondary | ICD-10-CM | POA: Diagnosis not present

## 2016-08-14 DIAGNOSIS — Z79899 Other long term (current) drug therapy: Secondary | ICD-10-CM | POA: Diagnosis not present

## 2016-08-14 DIAGNOSIS — I10 Essential (primary) hypertension: Secondary | ICD-10-CM | POA: Diagnosis not present

## 2016-08-14 DIAGNOSIS — M25551 Pain in right hip: Secondary | ICD-10-CM | POA: Insufficient documentation

## 2016-08-14 NOTE — ED Triage Notes (Signed)
Pt c/o right hip after falling onto area on July 1 while playing basketball. Pt is able to ambulate without difficulty. No obvious deformity to area.
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
## 2016-08-14 NOTE — ED Notes (Addendum)
Pt states that she was playing sports (basketball and vollyball) with grand kids on "the Saturday before the 4th) and hurt her right hip. She states that it feels numb. Pt very jumpy and cannot sit still. Speaking very fast.

## 2016-08-14 NOTE — Discharge Instructions (Signed)
Please seek medical attention for any high fevers, chest pain, shortness of breath, change in behavior, persistent vomiting, bloody stool or any other new or concerning symptoms.  

## 2016-08-20 IMAGING — CR DG CHEST 2V
2 series · 2 of 2 positions shown · non-contrast
Comparison: 12/13/2012

CLINICAL DATA: TIA

EXAM:
CHEST  2 VIEW

[chest pa]
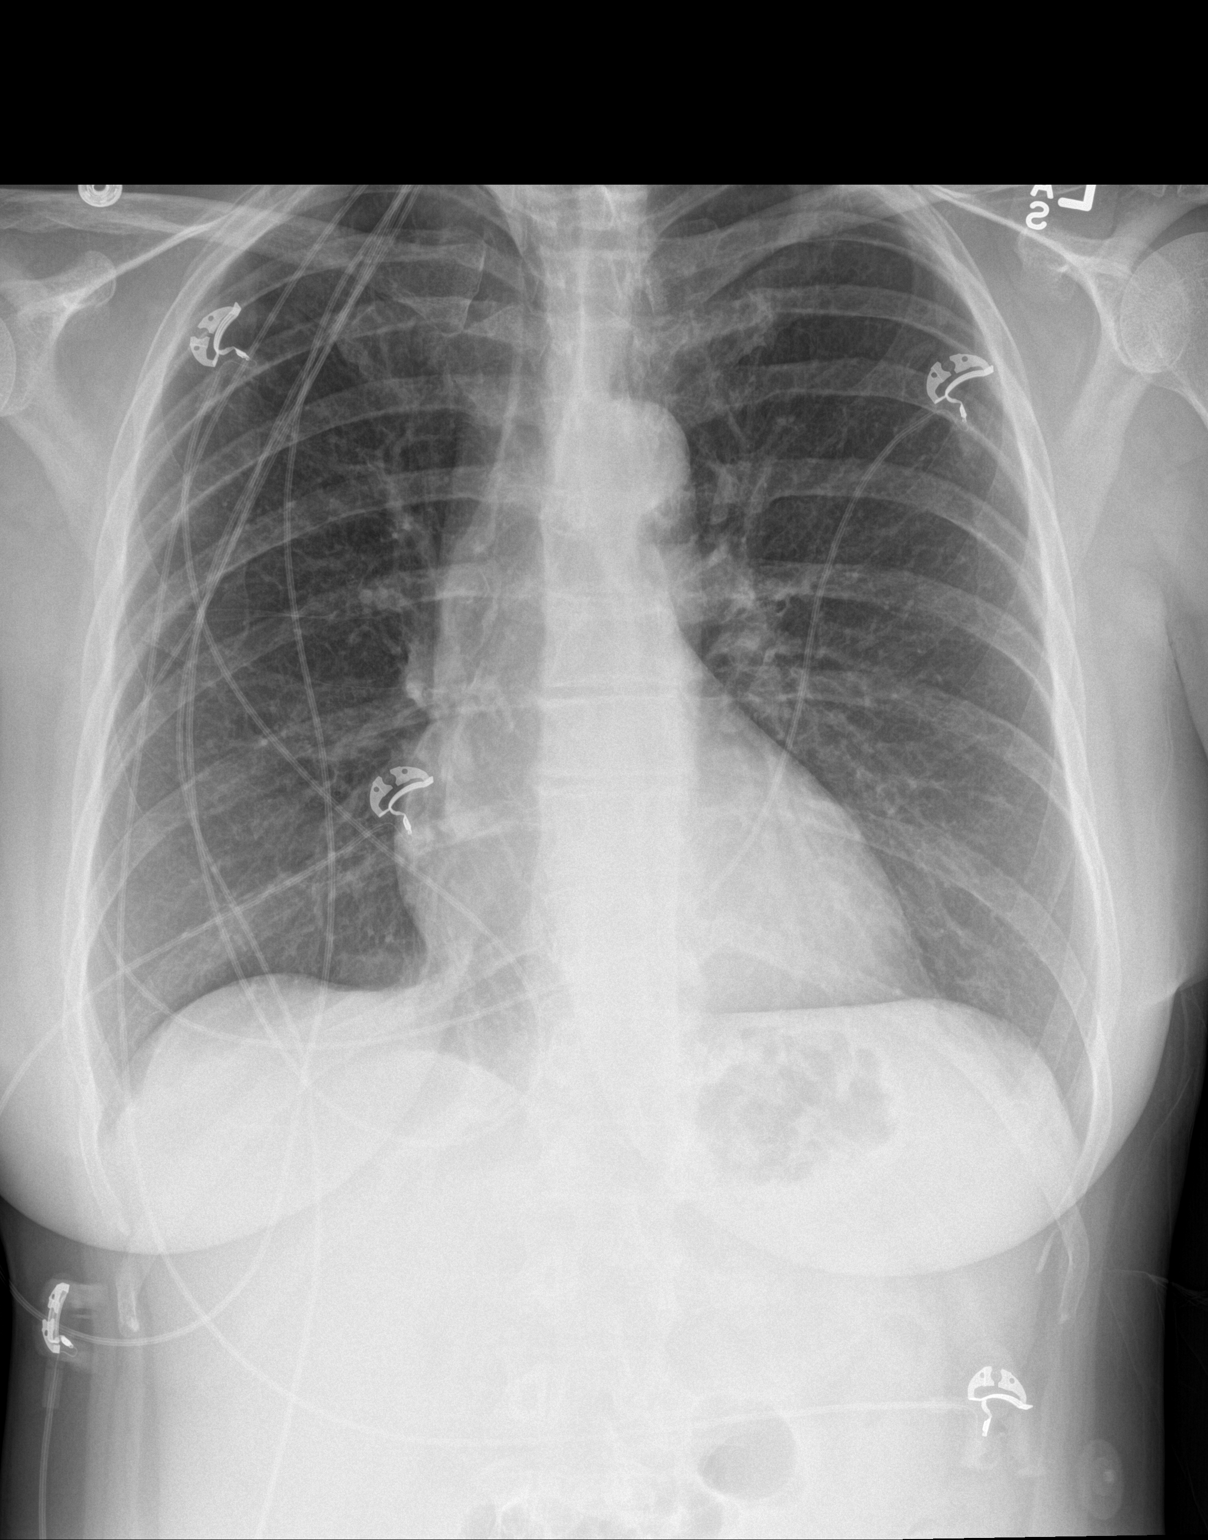

[chest lat]
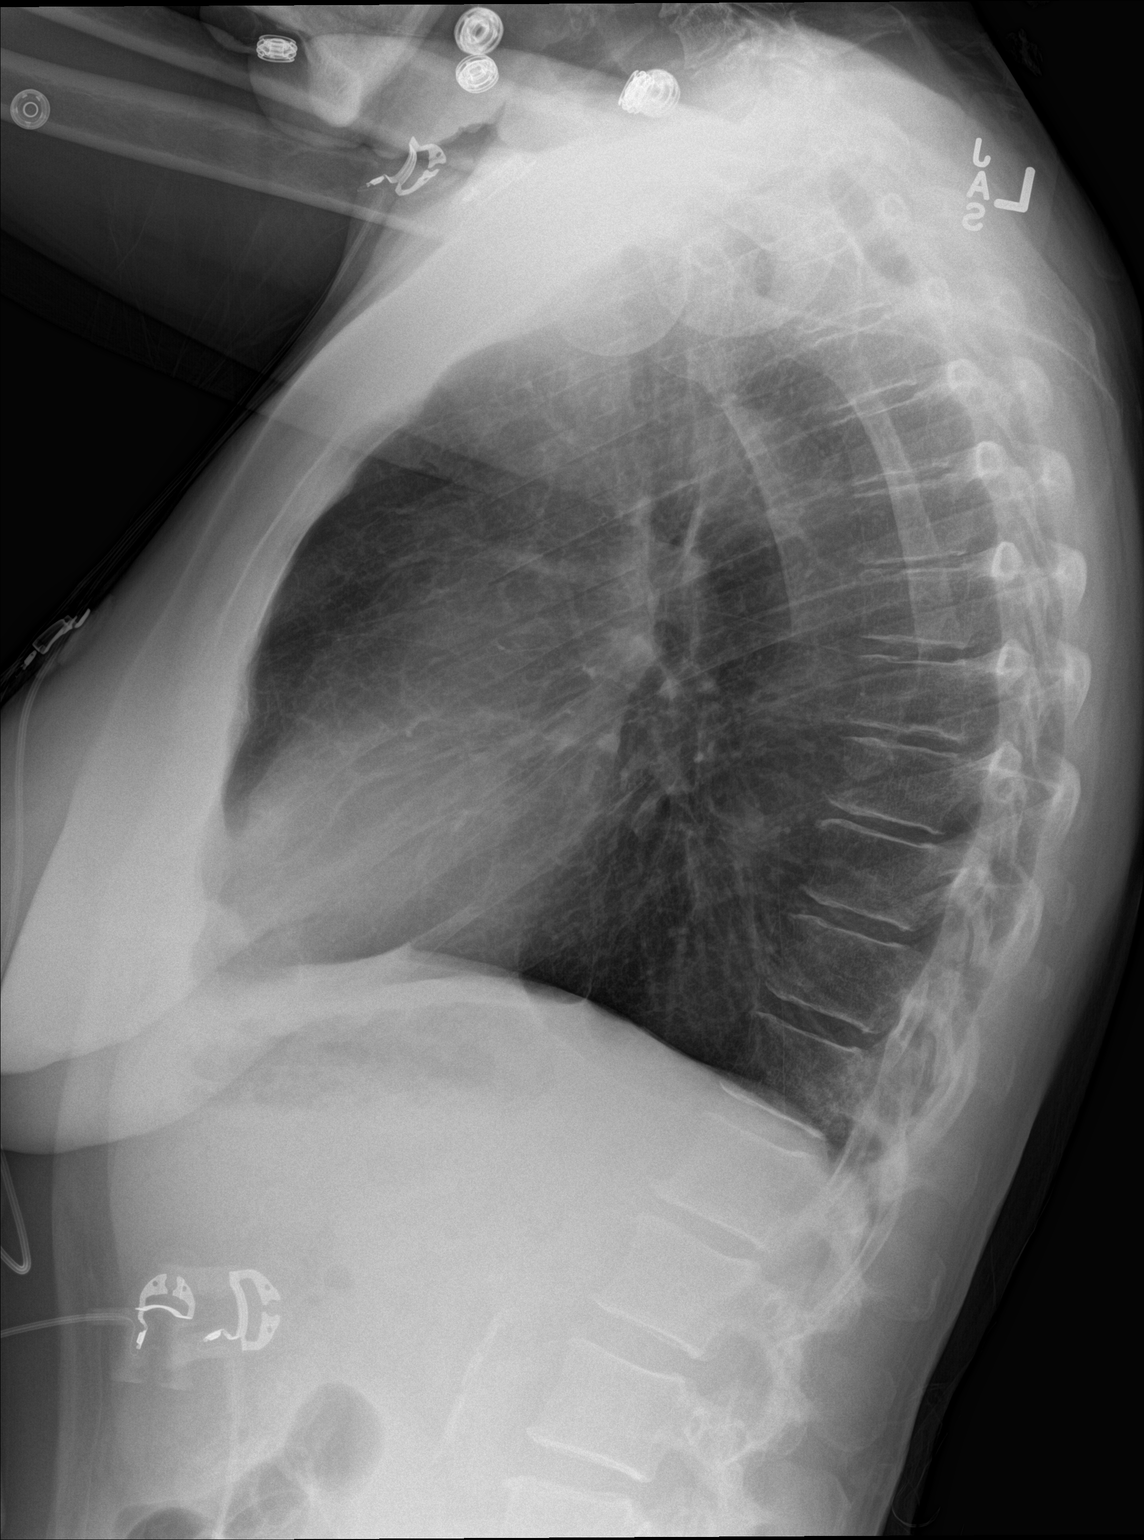

[2 of 2 positions shown; findings below may reference images not displayed]

FINDINGS: Lungs are clear.  No pleural effusion or pneumothorax.

The heart is normal in size.

Visualized osseous structures are within normal limits.
IMPRESSION: Normal chest radiographs.

## 2016-08-20 IMAGING — CT CT HEAD W/O CM
1 series · 16 of 30 positions shown, 20 images · non-contrast
Comparison: 06/25/2014.

CLINICAL DATA: Altered mental status. Fatigue and confusion for the
last few days.

EXAM:
CT HEAD WITHOUT CONTRAST
TECHNIQUE: Contiguous axial images were obtained from the base of the skull
through the vertex without intravenous contrast.

[Series 2: head 5.0 h30s · axial · 0.44mm/px · z∈[-59,+76]mm · 16 of 31 slices shown, 20 images]
[im 2/31  brain]
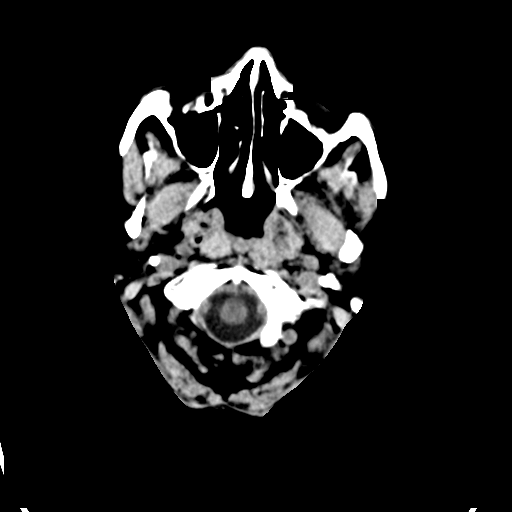
[im 2/31  bone]
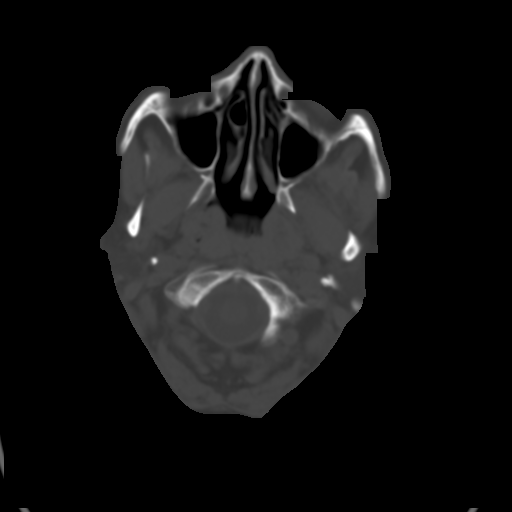
[im 4/31  brain]
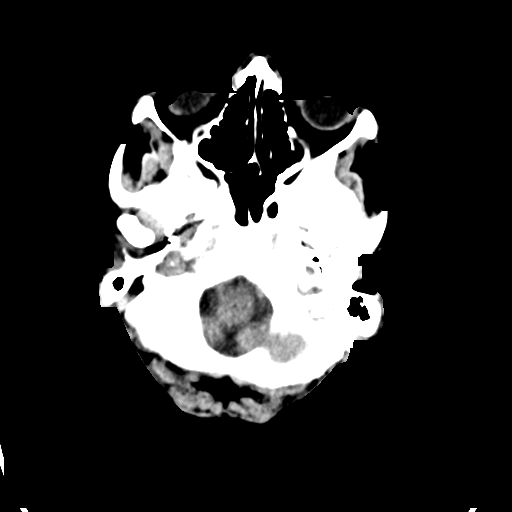
[im 6/31  brain]
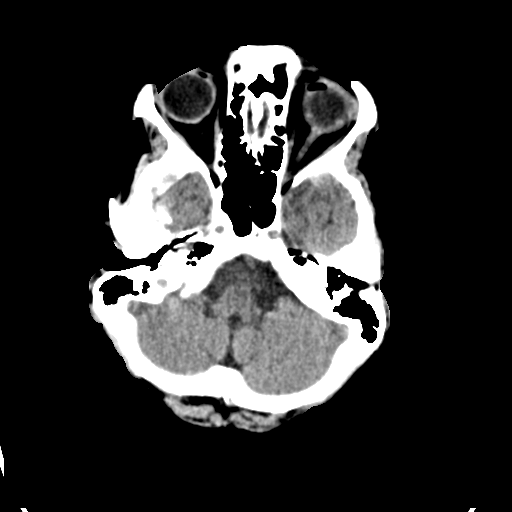
[im 8/31  brain]
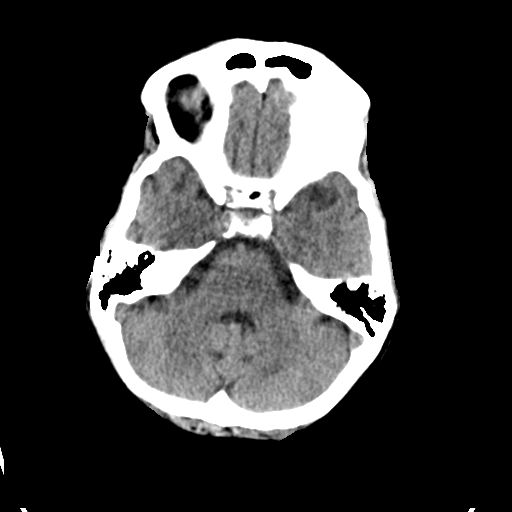
[im 9/31  brain]
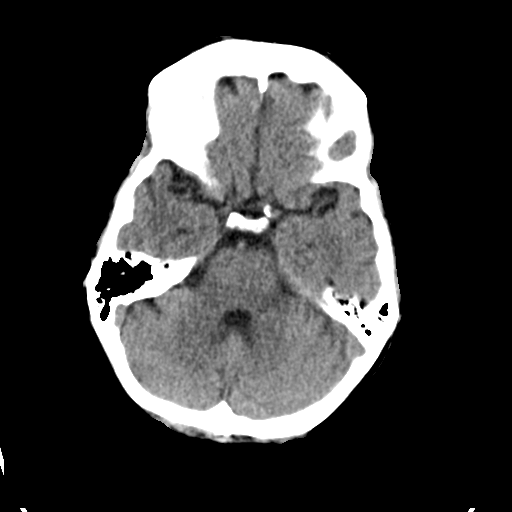
[im 9/31  bone]
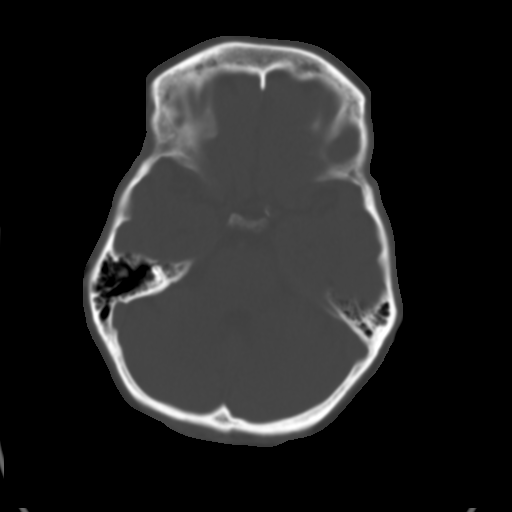
[im 11/31  brain]
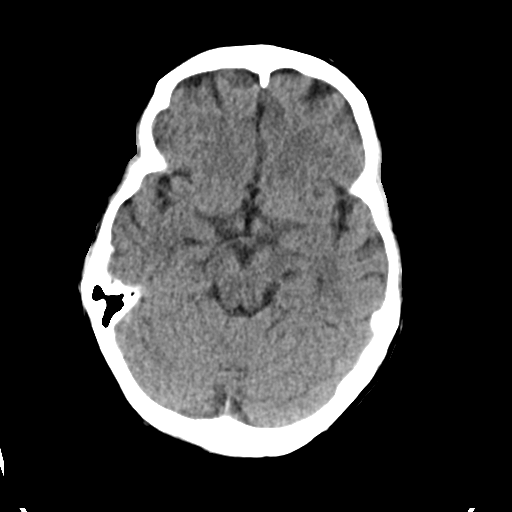
[im 13/31  brain]
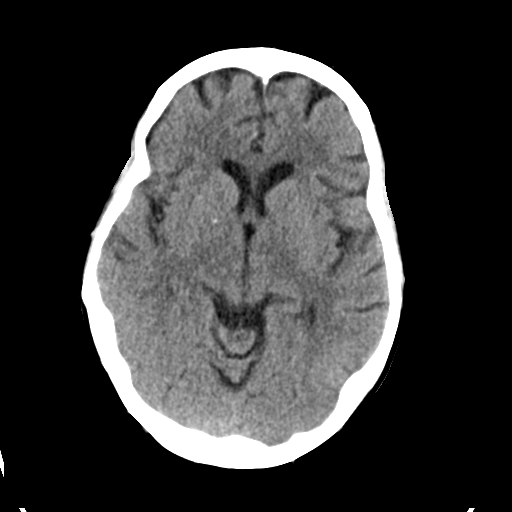
[im 15/31  brain]
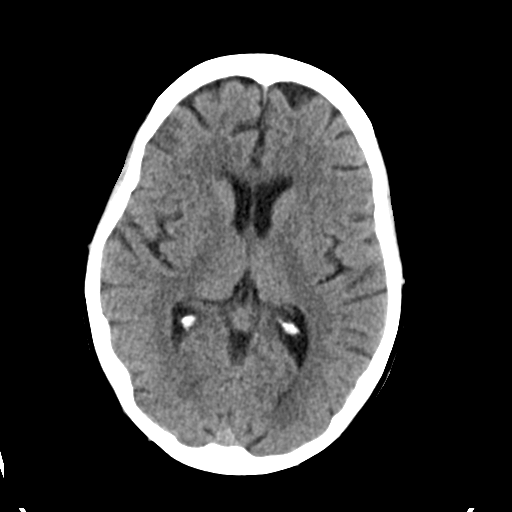
[im 16/31  brain]
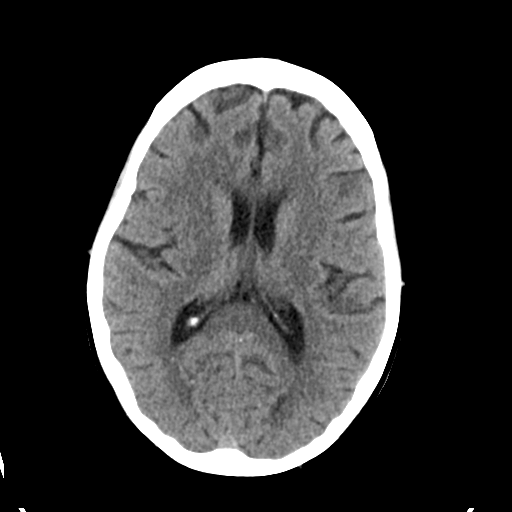
[im 16/31  bone]
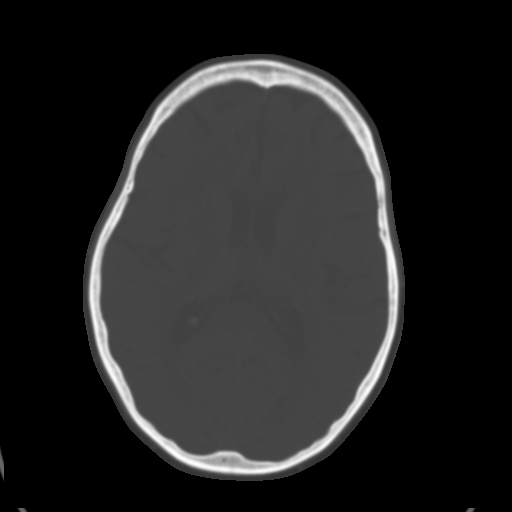
[im 18/31  brain]
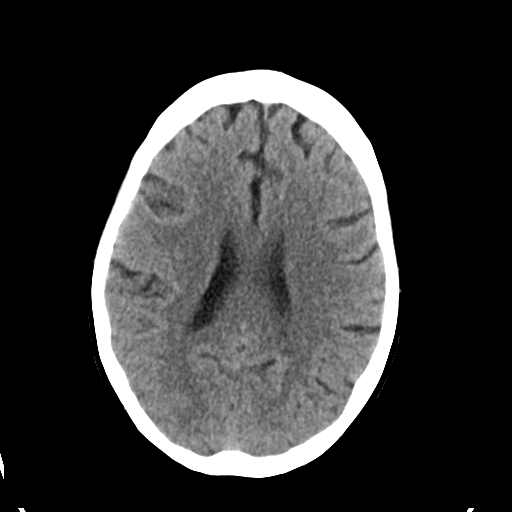
[im 20/31  brain]
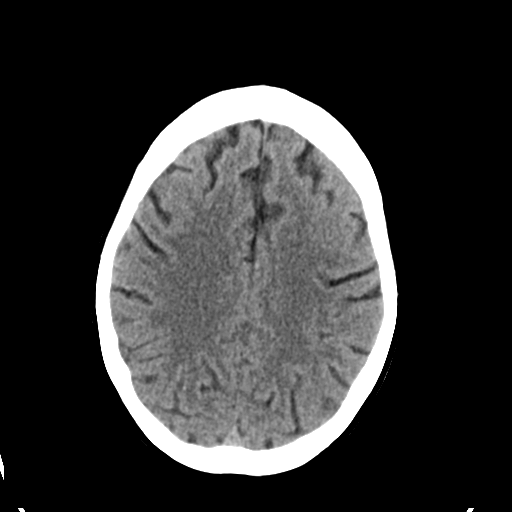
[im 22/31  brain]
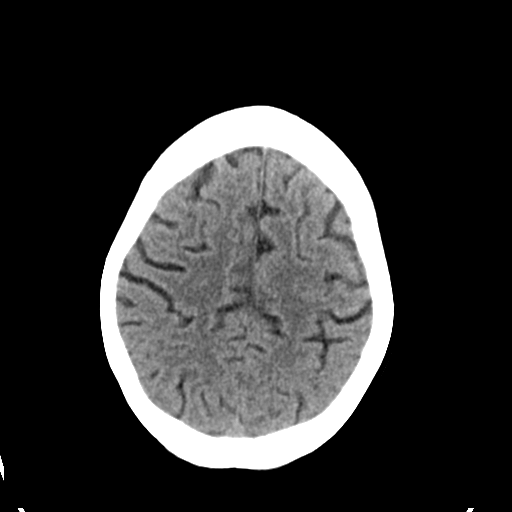
[im 23/31  brain]
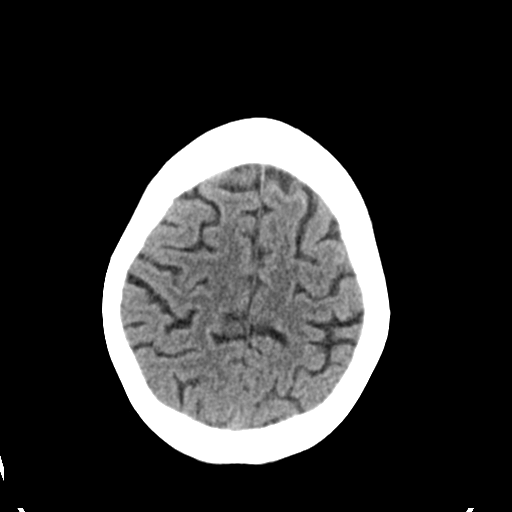
[im 23/31  bone]
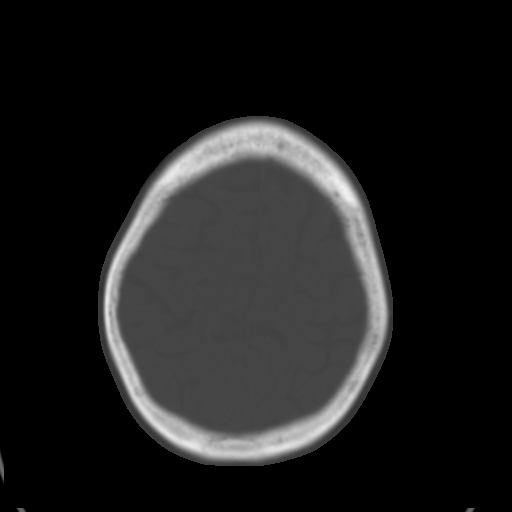
[im 25/31  brain]
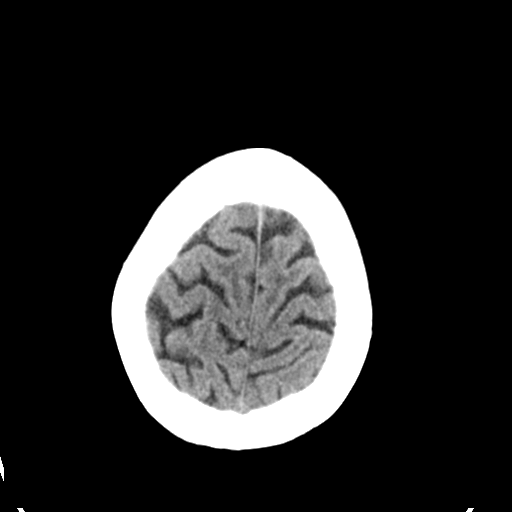
[im 27/31  brain]
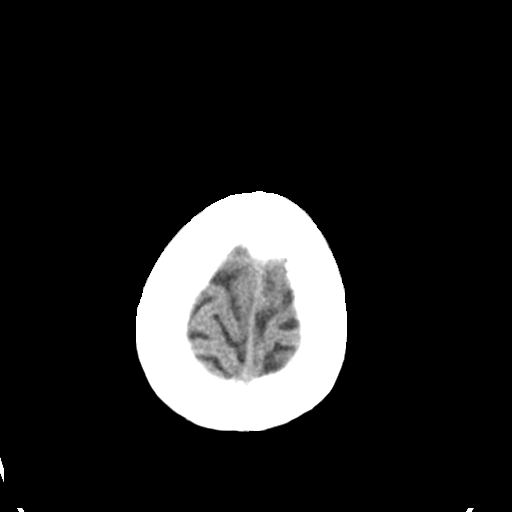
[im 29/31  brain]
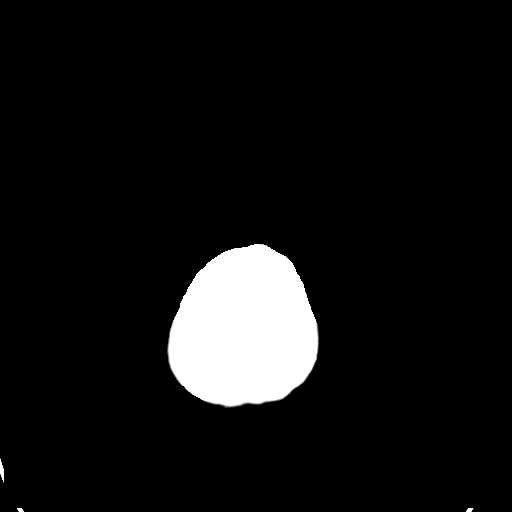

[16 of 30 positions shown; findings below may reference images not displayed]

FINDINGS: No mass lesion, mass effect, midline shift, hydrocephalus,
hemorrhage. No territorial ischemia or acute infarction. Calvarium
intact. Visible paranasal sinuses are normal.
IMPRESSION: Negative CT head.  No interval change.

## 2016-08-20 IMAGING — MR MR HEAD W/O CM
10 of 12 series · 30 of 48 positions shown · non-contrast
Comparison: CT head September 17, 2014 at 0605 hours

CLINICAL DATA: Several day history of confusion, generalized
weakness, slightly slurred speech. History of seizure, hypertension,
hypercholesterolemia.

EXAM:
MRI HEAD WITHOUT CONTRAST
MRA HEAD WITHOUT CONTRAST
TECHNIQUE: Multiplanar, multiecho pulse sequences of the brain and surrounding
structures were obtained without intravenous contrast. Angiographic
images of the head were obtained using MRA technique without
contrast.

[Series 3: DWI · axial · 3.0mm · 1.09mm/px · z∈[-88,+50]mm · 6 of 94 slices shown (1 of 4)]
[im 1/94]
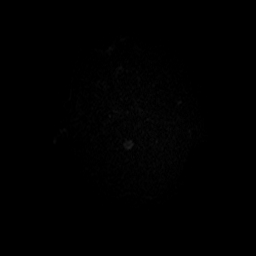
[im 19/94]
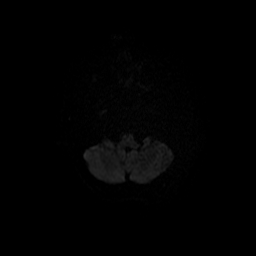
[im 38/94]
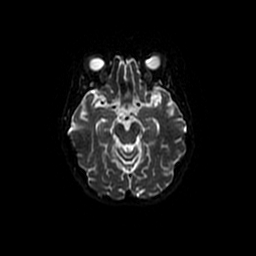
[im 56/94]
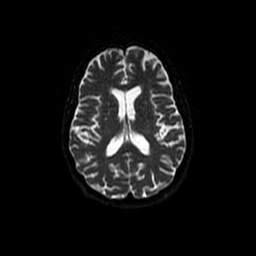
[im 75/94]
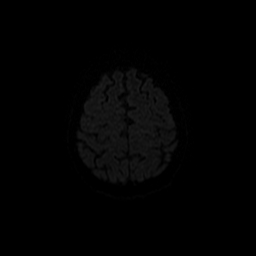
[im 94/94]
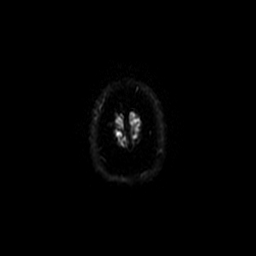

[Series 4: DWI · coronal · 5.0mm · 1.09mm/px · 5 of 70 slices shown (2 of 4)]
[im 1/70]
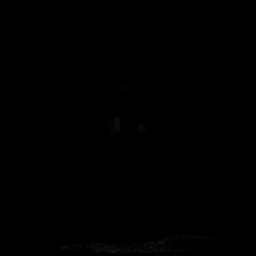
[im 18/70]
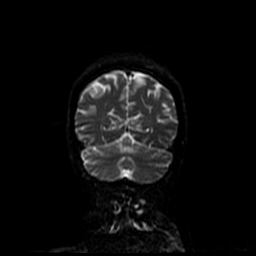
[im 35/70]
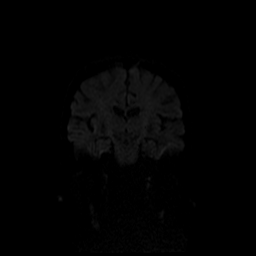
[im 52/70]
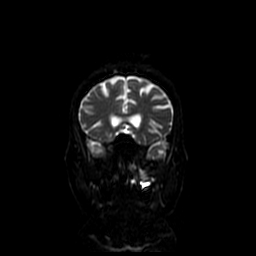
[im 70/70]
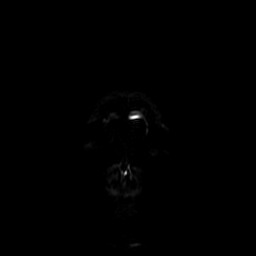

[Series 5: (id) mt fs · axial · 1.2mm · 0.43mm/px · z∈[-98,-71]mm · 3 of 168 slices shown]
[im 1/168]
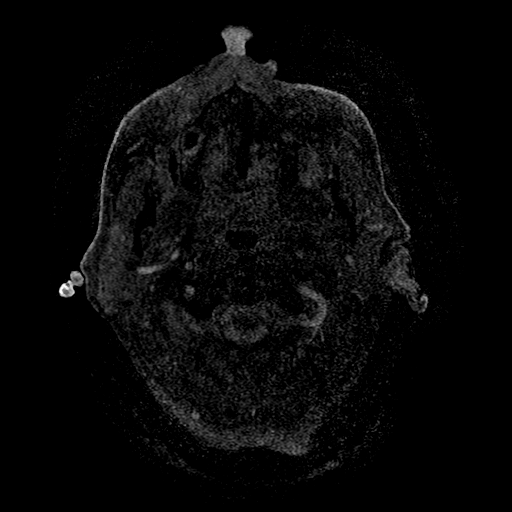
[im 31/168]
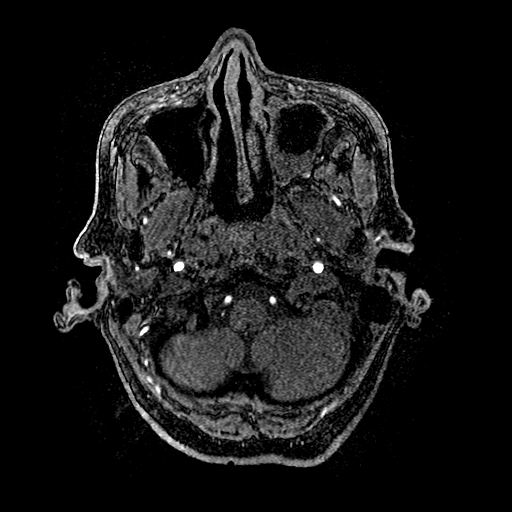
[im 46/168]
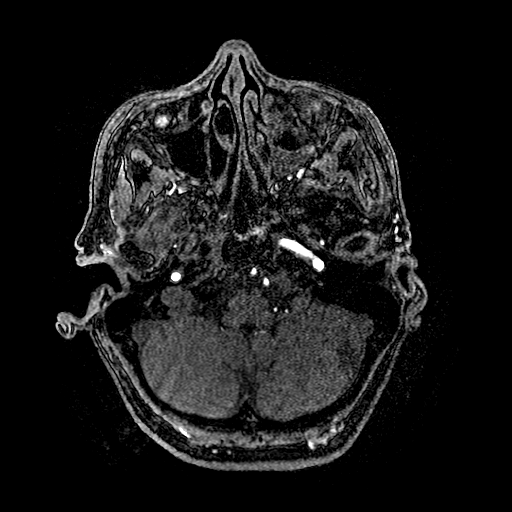

[Series 6: T1 · sagittal · 5.0mm · 0.47mm/px · 2 of 23 slices shown]
[im 1/23]
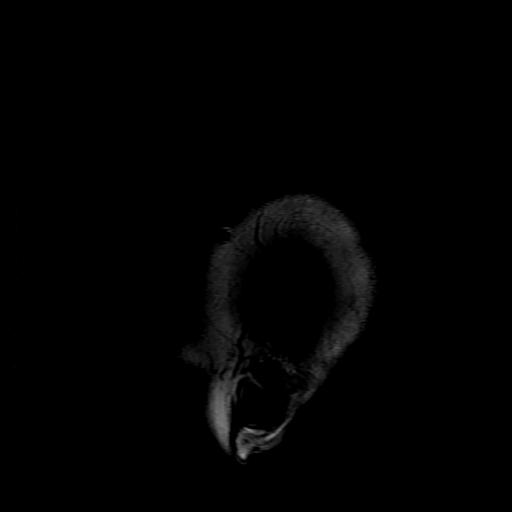
[im 23/23]
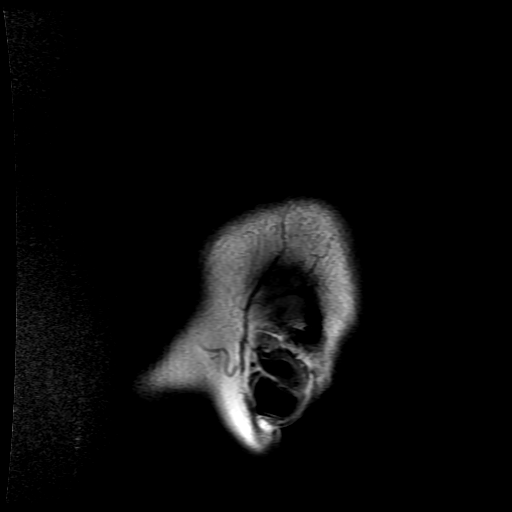

[Series 7: T2 · axial · 5.0mm · 0.43mm/px · z∈[-96,+42]mm · 2 of 24 slices shown (1 of 3)]
[im 1/24]
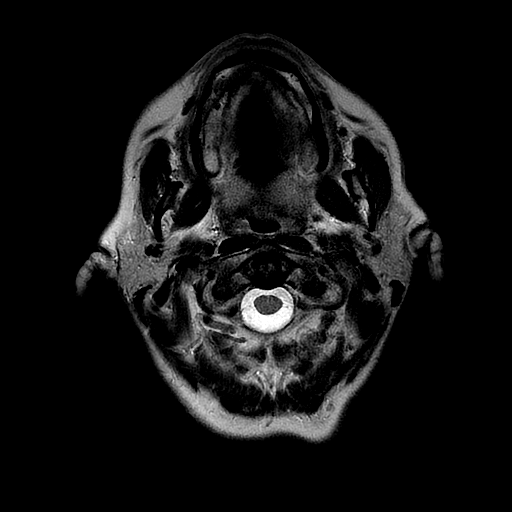
[im 24/24]
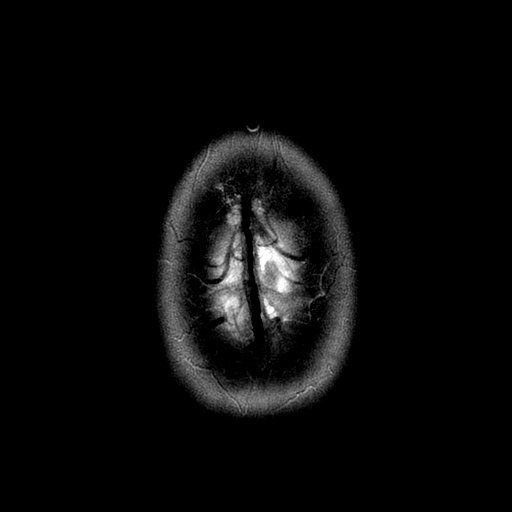

[Series 8: FLAIR · axial · 5.0mm · 0.43mm/px · z∈[-96,+42]mm · 2 of 24 slices shown]
[im 1/24]
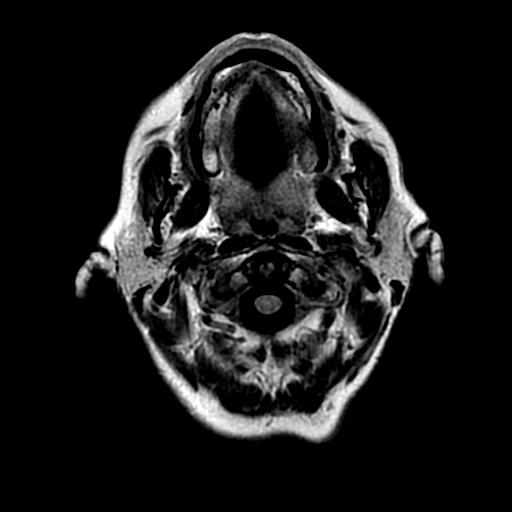
[im 24/24]
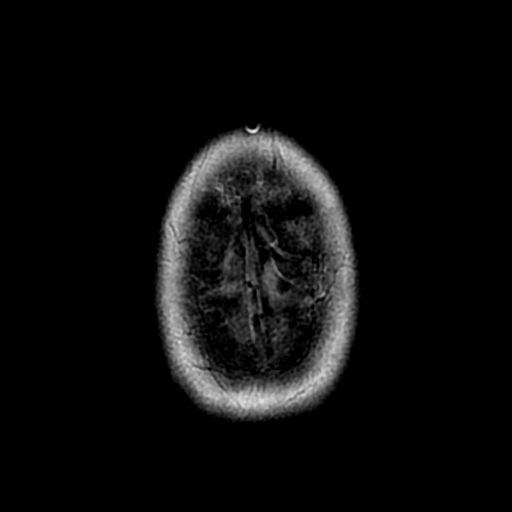

[Series 9: T2 · coronal · 5.0mm · 0.43mm/px · 2 of 30 slices shown (2 of 3)]
[im 1/30]
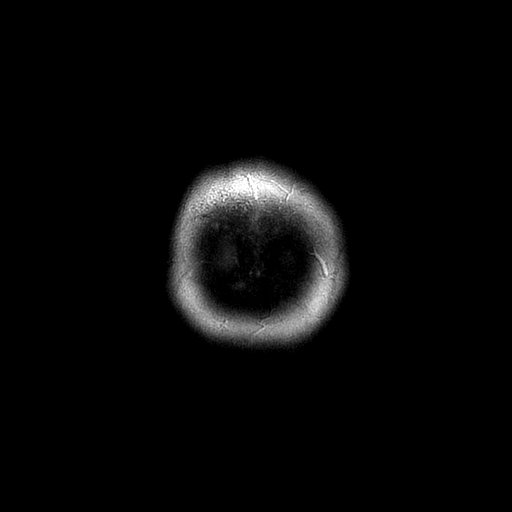
[im 30/30]
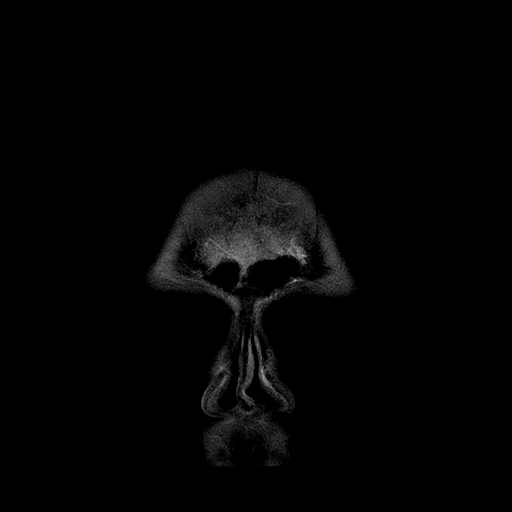

[Series 12: T2 · coronal · 3.0mm · 0.35mm/px · 2 of 25 slices shown (3 of 3)]
[im 1/25]
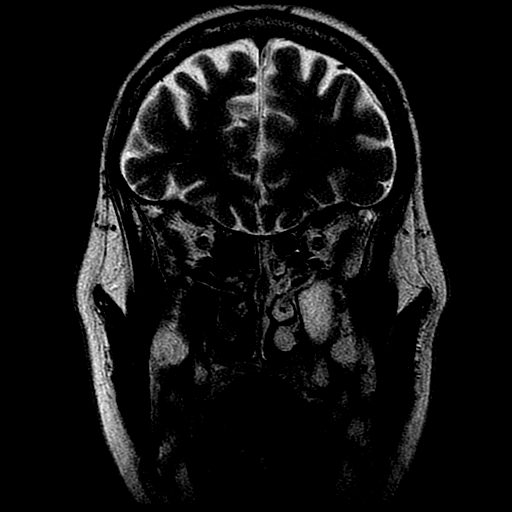
[im 25/25]
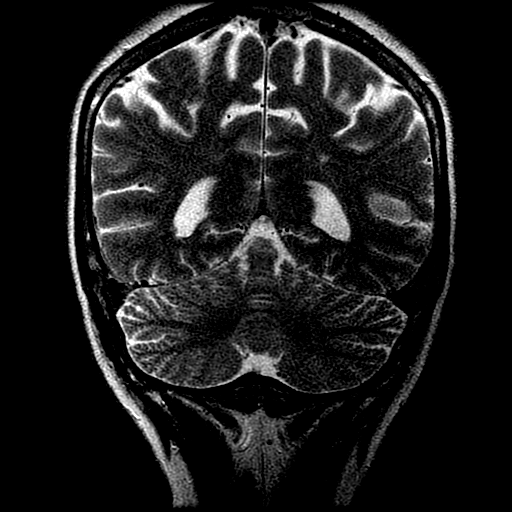

[Series 300: DWI · axial · 3.0mm · 1.09mm/px · z∈[-88,+50]mm · 3 of 47 slices shown (3 of 4)]
[im 1/47]
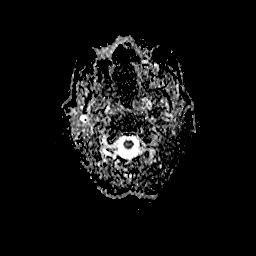
[im 24/47]
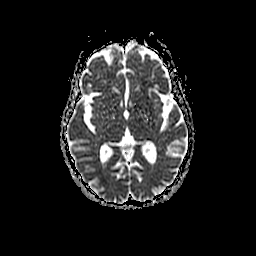
[im 47/47]
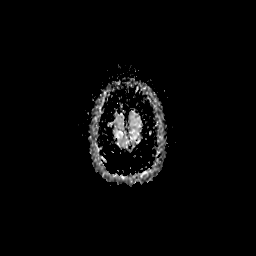

[Series 400: DWI · coronal · 5.0mm · 1.09mm/px · 3 of 35 slices shown (4 of 4)]
[im 1/35]
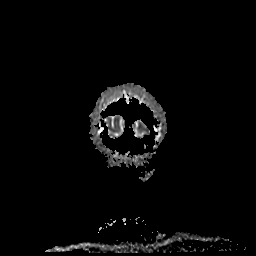
[im 18/35]
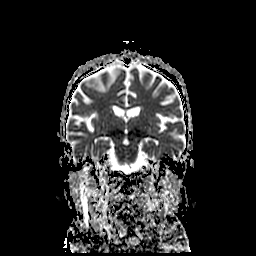
[im 35/35]
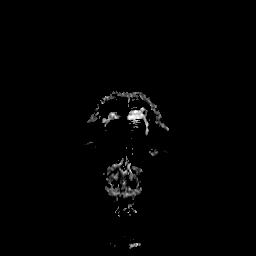

[30 of 48 positions shown; findings below may reference images not displayed]

FINDINGS: MRI HEAD FINDINGS

The ventricles and sulci are normal for patient's age. No suspicious
parenchymal signal, mass lesions, mass effect. A few subcentimeter
supratentorial white matter T2 hyperintensities are within normal
range for patient's age and though nonspecific, most often
associated with chronic small vessel ischemic disease. No reduced
diffusion to suggest acute ischemia. No susceptibility artifact to
suggest hemorrhage.

No abnormal extra-axial fluid collections. No extra-axial masses
though, contrast enhanced sequences would be more sensitive. Normal
major intracranial vascular flow voids seen at the skull base.
Symmetric size, morphology and signal characteristics of the
hippocampi.

Ocular globes and orbital contents are unremarkable though not
tailored for evaluation. No abnormal sellar expansion. Moderate LEFT
maxillary sinus mucosal thickening with air-fluid level. Mild LEFT
ethmoid mucosal thickening. No suspicious calvarial bone marrow
signal. No abnormal sellar expansion. Craniocervical junction
maintained.

MRA HEAD FINDINGS

Anterior circulation: Normal flow related enhancement of the
included cervical, petrous, cavernous and supra clinoid internal
carotid arteries. Mild symmetric loss of signal within the anterior
genu of the carotid siphons most consistent with artifact due to
tortuosity, less likely stenosis. Patent anterior communicating
artery. Normal flow related enhancement of the anterior and middle
cerebral arteries, including more distal segments.

No large vessel occlusion, high-grade stenosis, abnormal luminal
irregularity, aneurysm.

Posterior circulation: Codominant vertebral arteries. Basilar artery
is patent, with normal flow related enhancement of the main branch
vessels. Normal flow related enhancement of the posterior cerebral
arteries.

No large vessel occlusion, high-grade stenosis, abnormal luminal
irregularity, aneurysm.
IMPRESSION: Normal noncontrast MRI of the brain for age.

Negative MRA head.

## 2016-09-14 ENCOUNTER — Ambulatory Visit (HOSPITAL_COMMUNITY)
Admission: EM | Admit: 2016-09-14 | Discharge: 2016-09-14 | Disposition: A | Discharge status: Home / Self Care | Payer: 59 | Attending: Emergency Medicine | Admitting: Emergency Medicine

## 2016-09-14 ENCOUNTER — Encounter: Payer: Self-pay | Admitting: Gastroenterology

## 2016-09-14 ENCOUNTER — Encounter (HOSPITAL_COMMUNITY): Payer: Self-pay | Admitting: *Deleted

## 2016-09-14 DIAGNOSIS — K1379 Other lesions of oral mucosa: Secondary | ICD-10-CM | POA: Diagnosis not present

## 2016-09-14 DIAGNOSIS — I10 Essential (primary) hypertension: Secondary | ICD-10-CM | POA: Diagnosis not present

## 2016-09-14 DIAGNOSIS — K130 Diseases of lips: Secondary | ICD-10-CM

## 2016-09-14 MED ORDER — AZILSARTAN MEDOXOMIL 40 MG PO TABS: ORAL_TABLET | ORAL | 0 refills | Status: DC

## 2016-09-14 NOTE — Discharge Instructions (Signed)
You are given a prescription for 5 tablets of the Edarbi 40 mg. Follow-up with your knee PCP in 3 days. The small lump in your mouth is called a mucocele. Read the accompanying instructions regarding description of this diagnosis.

## 2016-09-14 NOTE — ED Triage Notes (Signed)
Patient reports unknown time period of left dental carry that she has had treated with antibiotics in the past, states it is worsening.   Patient also has been out of BP meds for over a month. Has appointment with a new PCP at the end of the week.
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
## 2016-10-29 ENCOUNTER — Other Ambulatory Visit: Payer: Self-pay | Admitting: Internal Medicine

## 2016-10-29 ENCOUNTER — Ambulatory Visit
Admission: RE | Admit: 2016-10-29 | Discharge: 2016-10-29 | Disposition: A | Discharge status: Home / Self Care | Payer: 59 | Source: Ambulatory Visit | Attending: Internal Medicine | Admitting: Internal Medicine

## 2016-10-29 DIAGNOSIS — R05 Cough: Secondary | ICD-10-CM

## 2016-10-29 DIAGNOSIS — R059 Cough, unspecified: Secondary | ICD-10-CM

## 2016-11-05 ENCOUNTER — Encounter: Payer: Self-pay | Admitting: Gastroenterology

## 2016-11-05 ENCOUNTER — Ambulatory Visit: Payer: 59 | Admitting: Gastroenterology

## 2017-01-01 ENCOUNTER — Ambulatory Visit: Payer: 59 | Admitting: Gastroenterology

## 2017-01-01 ENCOUNTER — Other Ambulatory Visit: Payer: Self-pay

## 2017-03-11 ENCOUNTER — Other Ambulatory Visit: Payer: Self-pay | Admitting: Internal Medicine

## 2017-03-11 DIAGNOSIS — R1013 Epigastric pain: Secondary | ICD-10-CM

## 2017-03-29 ENCOUNTER — Ambulatory Visit
Admission: RE | Admit: 2017-03-29 | Discharge: 2017-03-29 | Disposition: A | Discharge status: Home / Self Care | Payer: 59 | Source: Ambulatory Visit | Attending: Internal Medicine | Admitting: Internal Medicine

## 2017-03-29 DIAGNOSIS — R1013 Epigastric pain: Secondary | ICD-10-CM

## 2017-05-03 ENCOUNTER — Telehealth: Payer: Self-pay | Admitting: Acute Care

## 2017-05-03 ENCOUNTER — Ambulatory Visit (INDEPENDENT_AMBULATORY_CARE_PROVIDER_SITE_OTHER): Payer: POS | Admitting: Gastroenterology

## 2017-05-03 ENCOUNTER — Encounter: Payer: Self-pay | Admitting: Gastroenterology

## 2017-05-03 VITALS — BP 116/82 | HR 96 | Ht 61.5 in | Wt 136.4 lb

## 2017-05-03 DIAGNOSIS — R1314 Dysphagia, pharyngoesophageal phase: Secondary | ICD-10-CM | POA: Diagnosis not present

## 2017-05-03 DIAGNOSIS — R042 Hemoptysis: Secondary | ICD-10-CM

## 2017-05-03 DIAGNOSIS — K219 Gastro-esophageal reflux disease without esophagitis: Secondary | ICD-10-CM

## 2017-05-03 DIAGNOSIS — K2270 Barrett's esophagus without dysplasia: Secondary | ICD-10-CM

## 2017-05-03 DIAGNOSIS — Z87891 Personal history of nicotine dependence: Secondary | ICD-10-CM | POA: Diagnosis not present

## 2017-05-03 DIAGNOSIS — R140 Abdominal distension (gaseous): Secondary | ICD-10-CM

## 2017-05-03 NOTE — Patient Instructions (Addendum)
If you are age 58 or older, your body mass index should be between 23-30. Your Body mass index is 25.35 kg/m. If this is out of the aforementioned range listed, please consider follow up with your Primary Care Provider.  If you are age 58 or younger, your body mass index should be between 19-25. Your Body mass index is 25.35 kg/m. If this is out of the aformentioned range listed, please consider follow up with your Primary Care Provider.   You have been scheduled for an endoscopy. Please follow written instructions given to you at your visit today. If you use inhalers (even only as needed), please bring them with you on the day of your procedure. Your physician has requested that you go to www.startemmi.com and enter the access code given to you at your visit today. This web site gives a general overview about your procedure. However, you should still follow specific instructions given to you by our office regarding your preparation for the procedure.  We are referring you to [facility]. Please go downstairs and set up your appointment.  Thank you for choosing [facility]  Dr Amada JupiterHenry Danis III
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
## 2017-05-05 NOTE — Telephone Encounter (Signed)
Pt was referred for pulmonary consult for hemoptysis.  Schedulers will contact pt to schedule her.

## 2017-05-06 ENCOUNTER — Other Ambulatory Visit: Payer: Self-pay

## 2017-05-06 ENCOUNTER — Encounter: Payer: Self-pay | Admitting: Gastroenterology

## 2017-05-06 ENCOUNTER — Ambulatory Visit (AMBULATORY_SURGERY_CENTER): Payer: POS | Admitting: Gastroenterology

## 2017-05-06 VITALS — BP 149/104 | HR 96 | Temp 99.3°F | Resp 18 | Ht 61.5 in | Wt 136.0 lb

## 2017-05-06 DIAGNOSIS — K2270 Barrett's esophagus without dysplasia: Secondary | ICD-10-CM

## 2017-05-06 DIAGNOSIS — R1314 Dysphagia, pharyngoesophageal phase: Secondary | ICD-10-CM

## 2017-05-06 DIAGNOSIS — K259 Gastric ulcer, unspecified as acute or chronic, without hemorrhage or perforation: Secondary | ICD-10-CM | POA: Diagnosis not present

## 2017-05-06 DIAGNOSIS — K219 Gastro-esophageal reflux disease without esophagitis: Secondary | ICD-10-CM

## 2017-05-06 DIAGNOSIS — K3189 Other diseases of stomach and duodenum: Secondary | ICD-10-CM | POA: Diagnosis not present

## 2017-05-06 MED ORDER — SODIUM CHLORIDE 0.9 % IV SOLN: 500.0000 mL | Freq: Once | INTRAVENOUS | Status: DC

## 2017-05-06 NOTE — Progress Notes (Signed)
To PACU, VSS. Report to [email or web address] 

## 2017-05-06 NOTE — Op Note (Addendum)
[facility] Patient Name: Cristina Myers Procedure Date: 05/06/2017 9:30 AM MRN: [id number] Endoscopist: Sherilyn Cooter L. Myrtie Neither , MD Age: 57 Referring MD:  Date of Birth: 08-Aug-1960 Gender: Female Account #: [id number] Procedure:                Upper GI endoscopy Indications:              Dysphagia, Heartburn, Follow-up of Barrett's                            esophagus, Abdominal bloating Medicines:                Monitored Anesthesia Care Procedure:                Pre-Anesthesia Assessment:                           - Prior to the procedure, a History and Physical                            was performed, and patient medications and                            allergies were reviewed. The patient's tolerance of                            previous anesthesia was also reviewed. The risks                            and benefits of the procedure and the sedation                            options and risks were discussed with the patient.                            All questions were answered, and informed consent                            was obtained. Prior Anticoagulants: The patient has                            taken no previous anticoagulant or antiplatelet                            agents. ASA Grade Assessment: II - A patient with                            mild systemic disease. After reviewing the risks                            and benefits, the patient was deemed in                            satisfactory condition to undergo the procedure.  After obtaining informed consent, the endoscope was                            passed under direct vision. Throughout the                            procedure, the patient's blood pressure, pulse, and                            oxygen saturations were monitored continuously. The                            Endoscope was introduced through the mouth, and                            advanced to the second part of  duodenum. The upper                            GI endoscopy was accomplished without difficulty.                            The patient tolerated the procedure well. Scope In: Scope Out: Findings:                 The visualized larynx was normal. However, the                            vocal cords were not well seen.                           A 3 cm hiatal hernia was present (32-35cm from                            incisors).                           There were esophageal mucosal changes secondary to                            established short-segment Barrett's disease present                            in the distal esophagus. The maximum longitudinal                            extent of these mucosal changes was 3 cm in length.                            No raised or otherwise suspicious areas were seen                            within the Barrett's mucosa (examined under [facility] and  NBI) Mucosa was biopsied with a cold forceps for                            histology in a targeted manner at intervals of 2 cm                            at 30 (biopsies from 29 and 30cm range in same                            bottle) and 32 cm from the incisors. A total of 2                            specimen bottles were sent to pathology.                           A few, non-bleeding erosions were found in the                            cardia. There were no stigmata of recent bleeding.                            Multiple biopsies were obtained with cold forceps                            for histology - random gastric (Sidney protocol) to                            rule out H. pylori. 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                           The exam of the stomach was otherwise normal.                           The cardia and gastric fundus were normal on                            retroflexion.                            The examined duodenum was normal. Complications:            No immediate complications. Estimated Blood Loss:     Estimated blood loss: none. Estimated blood loss                            was minimal. Impression:               - Normal larynx.                           - 3 cm hiatal hernia.                           -  Esophageal mucosal changes secondary to                            established short-segment Barrett's disease.                            Biopsied.                           - Non-bleeding erosive gastropathy.                           - Normal examined duodenum.                           - Biopsies performed random gastric (Sidney                            protocol). Recommendation:           - Patient has a contact number available for                            emergencies. The signs and symptoms of potential                            delayed complications were discussed with the                            patient. Return to normal activities tomorrow.                            Written discharge instructions were provided to the                            patient.                           - Resume previous diet.                           - Continue present medications.                           - Await pathology results.                           - Follow an antireflux regimen indefinitely.                           - Attend pulmonary consult appointment as recently                            requested for hemoptysis and smoking history. Henry L. Myrtie Neither, MD 05/06/2017 9:58:27 AM This report has been signed electronically.

## 2017-05-06 NOTE — Patient Instructions (Signed)
Impression/Recommendations:  Hiatal hernia handout given to patient. Barrett's Esophagus handout given to patient.  Resume previous diet. Continue present medications.  Await pathology results.  Follow anti-reflux regimen.  Attend pulmonary consult appointment as recently requested.  YOU HAD AN ENDOSCOPIC PROCEDURE TODAY AT THE [facility]:   Refer to the procedure report that was given to you for any specific questions about what was found during the examination.  If the procedure report does not answer your questions, please call your gastroenterologist to clarify.  If you requested that your care partner not be given the details of your procedure findings, then the procedure report has been included in a sealed envelope for you to review at your convenience later.  YOU SHOULD EXPECT: Some feelings of bloating in the abdomen. Passage of more gas than usual.  Walking can help get rid of the air that was put into your GI tract during the procedure and reduce the bloating. If you had a lower endoscopy (such as a colonoscopy or flexible sigmoidoscopy) you may notice spotting of blood in your stool or on the toilet paper. If you underwent a bowel prep for your procedure, you may not have a normal bowel movement for a few days.  Please Note:  You might notice some irritation and congestion in your nose or some drainage.  This is from the oxygen used during your procedure.  There is no need for concern and it should clear up in a day or so.  SYMPTOMS TO REPORT IMMEDIATELY:   Following upper endoscopy (EGD)  Vomiting of blood or coffee ground material  New chest pain or pain under the shoulder blades  Painful or persistently difficult swallowing  New shortness of breath  Fever of 100F or higher  Black, tarry-looking stools  For urgent or emergent issues, a gastroenterologist can be reached at any hour by calling [phone].   DIET:  We do recommend a small meal at first,  but then you may proceed to your regular diet.  Drink plenty of fluids but you should avoid alcoholic beverages for 24 hours.  ACTIVITY:  You should plan to take it easy for the rest of today and you should NOT DRIVE or use heavy machinery until tomorrow (because of the sedation medicines used during the test).    FOLLOW UP: Our staff will call the number listed on your records the next business day following your procedure to check on you and address any questions or concerns that you may have regarding the information given to you following your procedure. If we do not reach you, we will leave a message.  However, if you are feeling well and you are not experiencing any problems, there is no need to return our call.  We will assume that you have returned to your regular daily activities without incident.  If any biopsies were taken you will be contacted by phone or by letter within the next 1-3 weeks.  Please call us at [phone] if you have not heard about the biopsies in 3 weeks.    SIGNATURES/CONFIDENTIALITY: You and/or your care partner have signed paperwork which will be entered into your electronic medical record.  These signatures attest to the fact that that the information above on your After Visit Summary has been reviewed and is understood.  Full responsibility of the confidentiality of this discharge information lies with you and/or your care-partner.

## 2017-05-06 NOTE — Progress Notes (Signed)
Called to room to assist during endoscopic procedure.  Patient ID and intended procedure confirmed with present staff. Received instructions for my participation in the procedure from the performing physician.  

## 2017-05-07 ENCOUNTER — Telehealth: Payer: Self-pay

## 2017-05-07 NOTE — Telephone Encounter (Signed)
Attempted to reach pt. With follow up call following endoscopic procedure 05/06/2017.  LM on pt. Voice mail.  Will try to reach pt. Again later today. 

## 2017-05-07 NOTE — Telephone Encounter (Signed)
Attempted to reach pt. With follow up call following endoscopic procedure 05/06/2017.  LM on pt. Ans. Machine to call if she has any questions or concerns.

## 2017-05-11 ENCOUNTER — Telehealth: Payer: Self-pay

## 2017-05-11 NOTE — Telephone Encounter (Signed)
Referral placed to [facility] per Dr Myrtie Neitheranis' request. Appointment has been made for 06-01-2017

## 2017-05-12 ENCOUNTER — Encounter: Payer: Self-pay | Admitting: Gastroenterology

## 2017-06-01 ENCOUNTER — Encounter: Payer: Self-pay | Admitting: Internal Medicine

## 2017-06-01 ENCOUNTER — Ambulatory Visit (INDEPENDENT_AMBULATORY_CARE_PROVIDER_SITE_OTHER): Payer: POS | Admitting: Internal Medicine

## 2017-06-01 ENCOUNTER — Ambulatory Visit (INDEPENDENT_AMBULATORY_CARE_PROVIDER_SITE_OTHER)
Admission: RE | Admit: 2017-06-01 | Discharge: 2017-06-01 | Disposition: A | Discharge status: Home / Self Care | Payer: POS | Source: Ambulatory Visit | Attending: Internal Medicine | Admitting: Internal Medicine

## 2017-06-01 VITALS — BP 120/80 | HR 104 | Ht 62.5 in | Wt 134.0 lb

## 2017-06-01 DIAGNOSIS — R0609 Other forms of dyspnea: Secondary | ICD-10-CM | POA: Diagnosis not present

## 2017-06-01 DIAGNOSIS — R042 Hemoptysis: Secondary | ICD-10-CM | POA: Diagnosis not present

## 2017-06-01 MED ORDER — BUDESONIDE-FORMOTEROL FUMARATE 80-4.5 MCG/ACT IN AERO: 2.0000 | INHALATION_SPRAY | Freq: Two times a day (BID) | RESPIRATORY_TRACT | 11 refills | Status: DC

## 2017-06-01 NOTE — Progress Notes (Signed)
Subjective:     Patient ID: Cristina Myers, female   DOB: 20-Dec-1960,    MRN: [id number]  HPI   26 yowf smoked since childhood but says for good quit 04/2017  With doe onset  in her late 30's  sensation of globus the entire time she smoked  improved but did not resolve with GI w/u  Pos for HH/ barretts as of 05/06/17  while on spiriva dpi and referred to pulmonary clinic 06/01/2017 by Dr   Myrtie Neither re hemoptysis        06/01/2017 1st [facility] office visit/ Javarion Douty   Chief Complaint  Patient presents with  . Pulmonary Consult    Referred by [facility] for eval of hemoptysis. Pt states that she has had hemoptysis off and on for the past 5 years-last episode was a few days ago.  She states that it occurs "sporadically, but worse when I get stressed".  She also c/o SOB "when I get stressed" and also with lifting something heavy.    Doe = MMRC4  = sob if tries to leave home or while getting dressed  On her worst days and on best = MMRC1 = can walk nl pace, flat grade, can't hurry or go uphills or steps s sob  / spiriva dpi not helping  Can't afford aciphex    And omeprzole not helping overt hb  Gets severe gagging when coughs but never coughs up food or appears to choke on it. Hemoptysis recurrent x "years and years" never more than a speck or two s assoc epistaxis    No obvious patterns in  day to day or daytime variability or assoc excess/ purulent sputum or mucus plugs   or cp or chest tightness, subjective wheeze or overt sinus or hb symptoms. No unusual exposure hx or h/o childhood pna/ asthma or knowledge of premature birth.  Sleeping ok  without nocturnal  or early am exacerbation  of respiratory  c/o's or need for noct saba. Also denies any obvious fluctuation of symptoms with weather or environmental changes or other aggravating or alleviating factors except as outlined above   Current Allergies, Complete Past Medical History, Past Surgical History, Family History, and Social History  were reviewed in [employer] record.  ROS  The following are not active complaints unless bolded Hoarseness, sore throat, dysphagia, dental problems, itching, sneezing,  nasal congestion or discharge of excess mucus or purulent secretions, ear ache,   fever, chills, sweats, unintended wt loss or wt gain, classically pleuritic or exertional cp,  orthopnea pnd or arm/hand swelling  or leg swelling, presyncope, palpitations, abdominal pain, anorexia, nausea, vomiting, diarrhea  or change in bowel habits or change in bladder habits, change in stools or change in urine, dysuria, hematuria,  rash, arthralgias, visual complaints, headache, numbness, weakness or ataxia or problems with walking or coordination,  change in mood= very anxious or  memory.        Current Meds  Medication Sig  . amphetamine-dextroamphetamine (ADDERALL) 30 MG tablet Take 30 mg by mouth 4 (four) times daily.   . hydrochlorothiazide (HYDRODIURIL) 25 MG tablet Take 1 tablet by mouth daily.  . potassium chloride (MICRO-K) 10 MEQ CR capsule Take 1 tablet by mouth daily.  . [DISCONTINUED] tiotropium (SPIRIVA HANDIHALER) 18 MCG inhalation capsule Place 1 capsule into inhaler and inhale daily.               Review of Systems     Objective:   Physical Exam  hoarse amb wf nad but with ? Flight of ideas    Wt Readings from Last 3 Encounters:  06/01/17 134 lb (60.8 kg)  05/06/17 136 lb (61.7 kg)  05/03/17 136 lb 6 oz (61.9 kg)     Vital signs reviewed - Note on arrival 02 sats  97% on RA   HEENT: nl  turbinates bilaterally, and oropharynx. Nl external ear canals without cough reflex - top dentures    NECK :  without JVD/Nodes/TM/ nl carotid upstrokes bilaterally   LUNGS: no acc muscle use,  Nl contour chest which is clear to A and P bilaterally without cough on insp or exp maneuvers   CV:  RRR  no s3 or murmur or increase in P2, and no edema   ABD:  soft and nontender with nl  inspiratory excursion in the supine position. No bruits or organomegaly appreciated, bowel sounds nl  MS:  Nl gait/ ext warm without deformities, calf tenderness, cyanosis or clubbing No obvious joint restrictions   SKIN: warm and dry without lesions    NEURO:  alert, approp, nl sensorium with  no motor or cerebellar deficits apparent.    CXR PA and Lateral:   06/01/2017 :    I personally reviewed images and  impression as follows:   Mild copd pattern/ no acute changes           Assessment:

## 2017-06-01 NOTE — Patient Instructions (Signed)
Stop spiriva  And your problems should improve   If you feel your breathing is getting worse, take symbicort 80 up to 2 pffs every 12 hours  Work on inhaler technique:  relax and gently blow all the way out then take a nice smooth deep breath back in, triggering the inhaler at same time you start breathing in.  Hold for up to 5 seconds if you can. Blow out thru nose. Rinse and gargle with water when done   follow up for your reflux meds per GI department   GERD (REFLUX)  is an extremely common cause of respiratory symptoms just like yours , many times with no obvious heartburn at all.    It can be treated with medication, but also with lifestyle changes including elevation of the head of your bed (ideally with 6 inch  bed blocks),  Smoking cessation, avoidance of late meals, excessive alcohol, and avoid fatty foods, chocolate, peppermint, colas, red wine, and acidic juices such as orange juice.  NO MINT OR MENTHOL PRODUCTS SO NO COUGH DROPS   USE SUGARLESS CANDY INSTEAD (Jolley ranchers or Stover's or Life Savers) or even ice chips will also do - the key is to swallow to prevent all throat clearing. NO OIL BASED VITAMINS - use powdered substitutes.   Please remember to go to the  x-ray department downstairs in the basement  for your tests - we will call you with the results when they are available.      Please schedule a follow up visit in 3 months but call sooner if needed  with all medications /inhalers/ solutions in hand so we can verify exactly what you are taking. This includes all medications from all doctors and over the counters      .

## 2017-06-02 ENCOUNTER — Encounter: Payer: Self-pay | Admitting: Internal Medicine

## 2017-06-02 DIAGNOSIS — R042 Hemoptysis: Secondary | ICD-10-CM | POA: Insufficient documentation

## 2017-06-02 NOTE — Assessment & Plan Note (Signed)
Reportedly dates back years now assoc with upper airway symptoms of gagging/ globus and dysphagia with nl cxr so feel fob very low yield but asked her to call right away if the pattern changes and we can arrange fob   Discussed in detail all the  indications, usual  risks and alternatives  relative to the benefits with patient who agrees to proceed with conservative f/u as outlined

## 2017-06-02 NOTE — Assessment & Plan Note (Signed)
06/01/2017  Walked RA x 3 laps @ 185 ft each stopped due to  End of study, fast pace, no sob or desat    - Spirometry 06/01/2017  FEV1 2.02 (82%)  Ratio 90  On spiriva with lots of upper airway symptoms > d/c spiriva  DDX of  difficult airways management almost all start with A and  include Adherence, Ace Inhibitors, Acid Reflux, Active Sinus Disease, Alpha 1 Antitripsin deficiency, Anxiety masquerading as Airways dz,  ABPA,  Allergy(esp in young), Aspiration (esp in elderly), Adverse effects of meds,  Active smokers, A bunch of PE's (a small clot burden can't cause this syndrome unless there is already severe underlying pulm or vascular dz with poor reserve) plus two Bs  = Bronchiectasis and Beta blocker use..and one C= CHF  Adherence is always the initial "prime suspect" and is a multilayered concern that requires a "trust but verify" approach in every patient - starting with knowing how to use medications, especially inhalers, correctly, keeping up with refills and understanding the fundamental difference between maintenance and prns vs those medications only taken for a very short course and then stopped and not refilled.  - rec always return with all meds in hand using a trust but verify approach to confirm accurate Medication  Reconciliation The principal here is that until we are certain that the  patients are doing what we've asked, it makes no sense to ask them to do more.   ? Adverse effects of dpi > try off spiriva   ? Anxiety > usually at the bottom of this list of usual suspects but should be much higher on this pt's based on H and P and note already on psychotropics and may interfere with adherence and also interpretation of response or lack thereof to symptom management which can be quite subjective.   ? Allergy/asthma > can't exclude with such variable sob > try symb 80 2bid - 06/01/2017  After extensive coaching inhaler device  effectiveness =    75%   ? Acid (or non-acid) GERD > always  difficult to exclude as up to 75% of pts in some series report no assoc GI/ Heartburn symptoms> rec  F/u with GI re access to approp acid suppression meds/ diet reviewed  ? Active smoking > denies but at high risk  I reviewed the Fletcher curve with the patient that basically indicates  if you quit smoking when your best day FEV1 is still well preserved (as is clearly  the case here)  it is highly unlikely you will progress to severe disease and informed the patient there was  no medication on the market that has proven to alter the curve/ its downward trajectory  or the likelihood of progression of their disease(unlike other chronic medical conditions such as atheroclerosis where we do think we can change the natural hx with risk reducing meds)    Therefore stopping smoking and maintaining abstinence are  the most important aspects of her care, not choice of inhalers or for that matter, doctors.   Treatment other than smoking cessation  is entirely directed by severity of symptoms and focused also on reducing exacerbations, not attempting to change the natural history of the disease.    F/u can be in 3 months, sooner if needed   Total time devoted to counseling  > 50 % of initial 60 min office visit:  review case with pt/ discussion of options/alternatives/ personally creating written customized instructions  in presence of pt  then  going over those specific  Instructions directly with the pt including how to use all of the meds but in particular covering each new medication in detail and the difference between the maintenance= "automatic" meds and the prns using an action plan format for the latter (If this problem/symptom => do that organization reading Left to right).  Please see AVS from this visit for a full list of these instructions which I personally wrote for this pt and  are unique to this visit.

## 2017-06-09 ENCOUNTER — Encounter: Payer: Self-pay | Admitting: *Deleted

## 2017-06-09 NOTE — Progress Notes (Signed)
LMTCB

## 2017-06-09 NOTE — Progress Notes (Signed)
Letter mailed

## 2017-06-15 ENCOUNTER — Telehealth: Payer: Self-pay | Admitting: Internal Medicine

## 2017-06-15 NOTE — Telephone Encounter (Signed)
Notes recorded by Nyoka Cowden, MD on 06/02/2017 at 9:05 AM EDT Call pt: Reviewed cxr and no acute change so no change in recommendations   Advised pt of results. Pt understood and nothing further is needed.

## 2017-07-06 ENCOUNTER — Other Ambulatory Visit: Payer: Self-pay | Admitting: Internal Medicine

## 2017-07-06 DIAGNOSIS — Z1231 Encounter for screening mammogram for malignant neoplasm of breast: Secondary | ICD-10-CM

## 2017-07-08 ENCOUNTER — Ambulatory Visit: Payer: POS

## 2017-07-15 ENCOUNTER — Ambulatory Visit: Payer: POS

## 2017-08-16 ENCOUNTER — Encounter: Payer: Self-pay | Admitting: Internal Medicine

## 2017-08-31 ENCOUNTER — Ambulatory Visit: Payer: POS | Admitting: Internal Medicine

## 2017-09-02 ENCOUNTER — Encounter: Payer: Self-pay | Admitting: Adult Health

## 2017-09-02 ENCOUNTER — Ambulatory Visit: Payer: POS | Admitting: Nurse Practitioner

## 2017-09-02 ENCOUNTER — Ambulatory Visit (INDEPENDENT_AMBULATORY_CARE_PROVIDER_SITE_OTHER): Payer: POS | Admitting: Adult Health

## 2017-09-02 DIAGNOSIS — R0609 Other forms of dyspnea: Secondary | ICD-10-CM

## 2017-09-02 DIAGNOSIS — Z720 Tobacco use: Secondary | ICD-10-CM | POA: Diagnosis not present

## 2017-09-02 NOTE — Patient Instructions (Signed)
Great job cutting back on the smoking . Keep working toward not smoking at all.  May use Albuterol 2 puffs every 4hr as needed , this is your rescue inhaler .  Mucinex DM Twice daily  As needed  Cough /congestion  Follow up Dr. Sherene SiresWert  In 6 months and As needed

## 2017-09-02 NOTE — Assessment & Plan Note (Signed)
Cough and Dyspnea improved off Spiriva suspect she had upper airway issues from dry powder inhaler. She is improved.  From it she did not show any airflow obstruction. She may use albuterol as needed.  Encouraged on smoking cessation. Chest x-ray was clear.   Plan  Patient Instructions  Randie HeinzGreat job cutting back on the smoking . Keep working toward not smoking at all.  May use Albuterol 2 puffs every 4hr as needed , this is your rescue inhaler .  Mucinex DM Twice daily  As needed  Cough /congestion  Follow up Dr. Sherene SiresWert  In 6 months and As needed

## 2017-09-02 NOTE — Assessment & Plan Note (Signed)
Smoking cessation  

## 2017-09-02 NOTE — Progress Notes (Signed)
 @Patient  ID: Cristina Myers, female    DOB: 1960/03/24, 57 y.o.   MRN: [id number]  Chief Complaint  Patient presents with  . Follow-up    Cough     Referring provider: Harvest ForestBakare, Mobolaji B, MD  HPI: 57 year old female active smoker seen for pulmonary consult June 01, 2017 for shortness of breath and cough  TEST  Spirometry 06/01/2017  FEV1 2.02 (82%)  Ratio 90  On spiriva with lots of upper airway symptoms > d/c spiriva   09/02/2017 Follow up : Cough Rodena Goldmann/Dyspnea  Patient presents for a 3567-month follow-up.  Patient was seen last visit for a pulmonary consult for ongoing cough shortness of breath and intermittent hemoptysis.  Patient had complained that she would have significant coughing paroxysms in fact where she would see specks of blood in her mucus.  Sometimes she would cough so hard she is she would vomit.  Spirometry showed only mild restriction with no airflow obstruction despite a long heavy history of smoking.  Patient was on Spiriva but felt to have upper airway cough from irritation possibly from a dry powder.  Spiriva was stopped.  Patient was encouraged on smoking cessation.  And continued on GERD precautions. Since last visit patient says she is improved her cough decreased substantially.  And she stopped having coughing paroxysms after stopping Spiriva.  Had no further episodes of seeing blood-tinged mucus.   Spiriva was stopped at last visit, feels that this really helped. Was told that may need to start Symbicort but did not have to take it.  Chest xray 06/2017 was clear.  Working on not smoking . Has cut back to 1 cig a day.  She says she is trying to be more active.  Trying to work on her anxiety.      Allergies  Allergen Reactions  . Bee Venom Swelling  . Chantix [Varenicline] Other (See Comments)    Mood swings, "more emotional"    There is no immunization history for the selected administration types on file for this patient.  Past Medical History:  Diagnosis  Date  . ADHD (attention deficit hyperactivity disorder)    Hattie Perch/notes 09/17/2014  . Anxiety state, unspecified   . Arthritis    "hands, back" (09/17/2014)  . Attention deficit disorder without mention of hyperactivity    meds per psych as of 12/2009  . Backache, unspecified    MRI lumbar region, dessicam disc's L3/4, L4/5: 5/S1. 05/2006.  . Barrett's esophagus    EGD 03/14/03.  EGD- prox to mid esophagus 10/12/06  . Carpal tunnel syndrome    bilateral  . Cervicalgia    with degenerative changes from C3- C7, MRI 05/2006. Narrowing  . Chronic prescription benzodiazepine use    Hattie Perch/notes 09/17/2014  . Constipation, chronic    history of  . Diverticulosis   . Elevated blood pressure reading without diagnosis of hypertension   . Family history of breast cancer   . GERD (gastroesophageal reflux disease)   . Hypertension   . Left knee pain   . Miscarriage    1994  . NSVD (normal spontaneous vaginal delivery)    x 4- 81, 83, 91, 98  . 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 Pure hypercholesterolemia   . Seizures (HCC)    "when I was real young; never medicated for them; went away"  . Thumb pain    bilateral  . Tobacco use disorder     Tobacco History: Social History   Tobacco Use  Smoking Status  Former Smoker  . Packs/day: 1.00  . Years: 46.00  . Pack years: 46.00  . Types: Cigarettes  . Last attempt to quit: 04/19/2017  . Years since quitting: 0.3  Smokeless Tobacco Never Used   Counseling given: Not Answered   Outpatient Medications Prior to Visit  Medication Sig Dispense Refill  . amphetamine-dextroamphetamine (ADDERALL) 30 MG tablet Take 30 mg by mouth 3 (three) times daily.     . budesonide-formoterol (SYMBICORT) 80-4.5 MCG/ACT inhaler Inhale 2 puffs into the lungs 2 (two) times daily. (Patient not taking: Reported on 09/02/2017) 1 Inhaler 11  . hydrochlorothiazide (HYDRODIURIL) 25 MG tablet Take 1 tablet by mouth daily.    . potassium chloride (MICRO-K) 10 MEQ CR capsule  Take 1 tablet by mouth daily.     No facility-administered medications prior to visit.      Review of Systems  Constitutional:   No  weight loss, night sweats,  Fevers, chills,  +fatigue, or  lassitude.  HEENT:   No headaches,  Difficulty swallowing,  Tooth/dental problems, or  Sore throat,                No sneezing, itching, ear ache, nasal congestion, post nasal drip,   CV:  No chest pain,  Orthopnea, PND, swelling in lower extremities, anasarca, dizziness, palpitations, syncope.   GI  No heartburn, indigestion, abdominal pain, nausea, vomiting, diarrhea, change in bowel habits, loss of appetite, bloody stools.   Resp:   No chest wall deformity  Skin: no rash or lesions.  GU: no dysuria, change in color of urine, no urgency or frequency.  No flank pain, no hematuria   MS:  No joint pain or swelling.  No decreased range of motion.  No back pain.    Physical Exam  BP 116/72 (BP Location: Left Arm, Cuff Size: Normal)   Pulse 95   Ht 5' 3.5" (1.613 m)   Wt 133 lb 6.4 oz (60.5 kg)   SpO2 97%   BMI 23.26 kg/m   GEN: A/Ox3; pleasant , NAD  HEENT:  [place]/AT,  EACs-clear, TMs-wnl, NOSE-clear, THROAT-clear, no lesions, no postnasal drip or exudate noted.   NECK:  Supple w/ fair ROM; no JVD; normal carotid impulses w/o bruits; no thyromegaly or nodules palpated; no lymphadenopathy.    RESP  Clear  P & A; w/o, wheezes/ rales/ or rhonchi. no accessory muscle use, no dullness to percussion  CARD:  RRR, no m/r/g, no peripheral edema, pulses intact, no cyanosis or clubbing.  GI:   Soft & nt; nml bowel sounds; no organomegaly or masses detected.   Musco: Warm bil, no deformities or joint swelling noted.   Neuro: alert, no focal deficits noted.    Skin: Warm, no lesions or rashes    Lab Results:  CBC  No results found for: BNP  ProBNP No results found for: PROBNP  Imaging: No results found.   Assessment & Plan:   Tobacco abuse Smoking cessation   DOE (dyspnea  on exertion) Cough and Dyspnea improved off Spiriva suspect she had upper airway issues from dry powder inhaler. She is improved.  From it she did not show any airflow obstruction. She may use albuterol as needed.  Encouraged on smoking cessation. Chest x-ray was clear.   Plan  Patient Instructions  Randie Heinz job cutting back on the smoking . Keep working toward not smoking at all.  May use Albuterol 2 puffs every 4hr as needed , this is your rescue inhaler .  Mucinex DM  Twice daily  As needed  Cough /congestion  Follow up Dr. Sherene Sires  In 6 months and As needed          Rubye Oaks, NP 09/02/2017

## 2017-09-06 NOTE — Progress Notes (Signed)
Chart and office note reviewed in detail  > agree with a/p as outlined    

## 2017-09-20 ENCOUNTER — Other Ambulatory Visit: Payer: Self-pay | Admitting: Internal Medicine

## 2017-09-20 DIAGNOSIS — F17210 Nicotine dependence, cigarettes, uncomplicated: Secondary | ICD-10-CM

## 2017-09-28 ENCOUNTER — Ambulatory Visit: Payer: POS

## 2017-10-07 ENCOUNTER — Ambulatory Visit: Payer: POS

## 2017-11-25 ENCOUNTER — Ambulatory Visit
Admission: RE | Admit: 2017-11-25 | Discharge: 2017-11-25 | Disposition: A | Discharge status: Home / Self Care | Payer: POS | Source: Ambulatory Visit | Attending: Internal Medicine | Admitting: Internal Medicine

## 2017-11-25 DIAGNOSIS — F17210 Nicotine dependence, cigarettes, uncomplicated: Secondary | ICD-10-CM

## 2017-11-29 ENCOUNTER — Other Ambulatory Visit: Payer: Self-pay | Admitting: Physical Medicine and Rehabilitation

## 2017-11-29 DIAGNOSIS — M5412 Radiculopathy, cervical region: Secondary | ICD-10-CM

## 2017-12-03 ENCOUNTER — Ambulatory Visit
Admission: RE | Admit: 2017-12-03 | Discharge: 2017-12-03 | Disposition: A | Discharge status: Home / Self Care | Payer: POS | Source: Ambulatory Visit | Attending: Physical Medicine and Rehabilitation | Admitting: Physical Medicine and Rehabilitation

## 2017-12-03 DIAGNOSIS — M5412 Radiculopathy, cervical region: Secondary | ICD-10-CM

## 2017-12-05 ENCOUNTER — Other Ambulatory Visit: Payer: POS

## 2018-02-10 ENCOUNTER — Ambulatory Visit: Payer: POS

## 2018-03-07 ENCOUNTER — Ambulatory Visit: Payer: POS | Admitting: Internal Medicine

## 2018-03-16 ENCOUNTER — Ambulatory Visit: Payer: POS

## 2018-03-29 ENCOUNTER — Ambulatory Visit: Payer: POS

## 2018-04-26 ENCOUNTER — Ambulatory Visit: Payer: Self-pay

## 2018-05-25 ENCOUNTER — Ambulatory Visit: Payer: Self-pay

## 2018-09-15 ENCOUNTER — Other Ambulatory Visit: Payer: Self-pay

## 2018-09-15 DIAGNOSIS — Z20822 Contact with and (suspected) exposure to covid-19: Secondary | ICD-10-CM

## 2018-09-17 LAB — NOVEL CORONAVIRUS, NAA: SARS-CoV-2, NAA: NOT DETECTED

## 2018-09-17 LAB — SPECIMEN STATUS REPORT

## 2018-11-10 ENCOUNTER — Other Ambulatory Visit: Payer: Self-pay | Admitting: Internal Medicine

## 2018-11-10 DIAGNOSIS — Z1231 Encounter for screening mammogram for malignant neoplasm of breast: Secondary | ICD-10-CM

## 2018-11-11 ENCOUNTER — Other Ambulatory Visit: Payer: Self-pay | Admitting: Internal Medicine

## 2018-11-11 DIAGNOSIS — Z122 Encounter for screening for malignant neoplasm of respiratory organs: Secondary | ICD-10-CM

## 2018-12-26 ENCOUNTER — Ambulatory Visit: Payer: Self-pay

## 2019-07-05 ENCOUNTER — Other Ambulatory Visit: Payer: Self-pay | Admitting: Internal Medicine

## 2019-07-05 DIAGNOSIS — Z1231 Encounter for screening mammogram for malignant neoplasm of breast: Secondary | ICD-10-CM

## 2019-07-10 ENCOUNTER — Other Ambulatory Visit: Payer: Self-pay | Admitting: Oral Surgery

## 2019-08-17 ENCOUNTER — Other Ambulatory Visit: Payer: Self-pay

## 2019-08-17 ENCOUNTER — Ambulatory Visit
Admission: RE | Admit: 2019-08-17 | Discharge: 2019-08-17 | Disposition: A | Discharge status: Home / Self Care | Payer: Medicaid Other | Source: Ambulatory Visit | Attending: Internal Medicine | Admitting: Internal Medicine

## 2019-08-17 DIAGNOSIS — Z1231 Encounter for screening mammogram for malignant neoplasm of breast: Secondary | ICD-10-CM

## 2020-02-03 DIAGNOSIS — I340 Nonrheumatic mitral (valve) insufficiency: Secondary | ICD-10-CM

## 2020-02-03 HISTORY — DX: Nonrheumatic mitral (valve) insufficiency: I34.0

## 2020-03-24 DIAGNOSIS — E7220 Disorder of urea cycle metabolism, unspecified: Secondary | ICD-10-CM | POA: Insufficient documentation

## 2020-04-08 DIAGNOSIS — I340 Nonrheumatic mitral (valve) insufficiency: Secondary | ICD-10-CM | POA: Diagnosis not present

## 2020-09-02 ENCOUNTER — Encounter: Payer: Self-pay | Admitting: Gastroenterology

## 2020-10-09 DIAGNOSIS — J432 Centrilobular emphysema: Secondary | ICD-10-CM | POA: Insufficient documentation

## 2020-11-07 ENCOUNTER — Other Ambulatory Visit (HOSPITAL_COMMUNITY): Payer: Self-pay

## 2020-11-07 MED ORDER — AMPHETAMINE-DEXTROAMPHETAMINE 20 MG PO TABS
20.0000 mg | ORAL_TABLET | Freq: Every day | ORAL | 0 refills | Status: DC | PRN
  Filled 2020-11-07: qty 60, 60d supply, fill #0

## 2020-11-29 DIAGNOSIS — F1020 Alcohol dependence, uncomplicated: Secondary | ICD-10-CM | POA: Insufficient documentation

## 2020-11-29 DIAGNOSIS — K709 Alcoholic liver disease, unspecified: Secondary | ICD-10-CM | POA: Insufficient documentation

## 2020-12-04 ENCOUNTER — Other Ambulatory Visit (HOSPITAL_COMMUNITY): Payer: Self-pay

## 2020-12-04 MED ORDER — AMPHETAMINE-DEXTROAMPHETAMINE 20 MG PO TABS
20.0000 mg | ORAL_TABLET | Freq: Every day | ORAL | 0 refills | Status: DC | PRN
  Filled 2020-12-04: qty 60, 60d supply, fill #0

## 2020-12-06 ENCOUNTER — Other Ambulatory Visit (HOSPITAL_COMMUNITY): Payer: Self-pay

## 2020-12-09 ENCOUNTER — Other Ambulatory Visit (HOSPITAL_COMMUNITY): Payer: Self-pay

## 2020-12-09 MED ORDER — AMPHETAMINE-DEXTROAMPHETAMINE 20 MG PO TABS
20.0000 mg | ORAL_TABLET | Freq: Every day | ORAL | 0 refills | Status: DC | PRN
  Filled 2020-12-09: qty 30, 30d supply, fill #0

## 2020-12-12 ENCOUNTER — Other Ambulatory Visit (HOSPITAL_COMMUNITY): Payer: Self-pay

## 2020-12-13 ENCOUNTER — Other Ambulatory Visit (HOSPITAL_COMMUNITY): Payer: Self-pay

## 2020-12-13 MED ORDER — AMPHETAMINE-DEXTROAMPHETAMINE 20 MG PO TABS
20.0000 mg | ORAL_TABLET | Freq: Two times a day (BID) | ORAL | 0 refills | Status: DC | PRN
  Filled 2020-12-13: qty 60, 30d supply, fill #0

## 2021-01-02 ENCOUNTER — Other Ambulatory Visit (HOSPITAL_COMMUNITY): Payer: Self-pay

## 2021-01-02 MED ORDER — AMPHETAMINE-DEXTROAMPHETAMINE 20 MG PO TABS
20.0000 mg | ORAL_TABLET | Freq: Two times a day (BID) | ORAL | 0 refills | Status: DC | PRN
  Filled 2021-01-02: qty 60, 30d supply, fill #0

## 2021-02-19 ENCOUNTER — Other Ambulatory Visit (HOSPITAL_COMMUNITY): Payer: Self-pay

## 2021-02-19 MED ORDER — AMPHETAMINE-DEXTROAMPHETAMINE 30 MG PO TABS
30.0000 mg | ORAL_TABLET | Freq: Two times a day (BID) | ORAL | 0 refills | Status: DC | PRN
  Filled 2021-02-19: qty 60, 30d supply, fill #0

## 2021-03-17 DIAGNOSIS — N1832 Chronic kidney disease, stage 3b: Secondary | ICD-10-CM | POA: Insufficient documentation

## 2021-03-17 DIAGNOSIS — F99 Mental disorder, not otherwise specified: Secondary | ICD-10-CM | POA: Insufficient documentation

## 2021-03-18 ENCOUNTER — Other Ambulatory Visit (HOSPITAL_COMMUNITY): Payer: Self-pay

## 2021-03-18 MED ORDER — TRAZODONE HCL 100 MG PO TABS
100.0000 mg | ORAL_TABLET | Freq: Every evening | ORAL | 0 refills | Status: DC
  Filled 2021-03-18: qty 90, 90d supply, fill #0

## 2021-03-19 ENCOUNTER — Other Ambulatory Visit (HOSPITAL_COMMUNITY): Payer: Self-pay

## 2021-03-21 ENCOUNTER — Other Ambulatory Visit (HOSPITAL_COMMUNITY): Payer: Self-pay

## 2021-03-21 MED ORDER — AMPHETAMINE-DEXTROAMPHETAMINE 30 MG PO TABS
30.0000 mg | ORAL_TABLET | Freq: Two times a day (BID) | ORAL | 0 refills | Status: DC
  Filled 2021-03-21: qty 7, 4d supply, fill #0

## 2021-03-21 MED ORDER — BUSPIRONE HCL 10 MG PO TABS
10.0000 mg | ORAL_TABLET | Freq: Three times a day (TID) | ORAL | 0 refills | Status: DC
Start: 2021-03-21 — End: 2021-04-02
  Filled 2021-03-21: qty 90, 30d supply, fill #0

## 2021-03-22 ENCOUNTER — Observation Stay (HOSPITAL_COMMUNITY)
Admission: EM | Admit: 2021-03-22 | Discharge: 2021-03-23 | Disposition: A | Discharge status: Home / Self Care | Payer: Medicare Other | Attending: Internal Medicine | Admitting: Internal Medicine

## 2021-03-22 ENCOUNTER — Emergency Department (HOSPITAL_COMMUNITY): Payer: Medicare Other

## 2021-03-22 ENCOUNTER — Other Ambulatory Visit: Payer: Self-pay

## 2021-03-22 ENCOUNTER — Encounter (HOSPITAL_COMMUNITY): Payer: Self-pay | Admitting: Internal Medicine

## 2021-03-22 DIAGNOSIS — F1010 Alcohol abuse, uncomplicated: Secondary | ICD-10-CM | POA: Diagnosis not present

## 2021-03-22 DIAGNOSIS — Z7951 Long term (current) use of inhaled steroids: Secondary | ICD-10-CM | POA: Diagnosis not present

## 2021-03-22 DIAGNOSIS — R0602 Shortness of breath: Secondary | ICD-10-CM | POA: Diagnosis present

## 2021-03-22 DIAGNOSIS — Z20822 Contact with and (suspected) exposure to covid-19: Secondary | ICD-10-CM | POA: Insufficient documentation

## 2021-03-22 DIAGNOSIS — F17210 Nicotine dependence, cigarettes, uncomplicated: Secondary | ICD-10-CM

## 2021-03-22 DIAGNOSIS — Z87891 Personal history of nicotine dependence: Secondary | ICD-10-CM | POA: Insufficient documentation

## 2021-03-22 DIAGNOSIS — I10 Essential (primary) hypertension: Secondary | ICD-10-CM | POA: Diagnosis not present

## 2021-03-22 DIAGNOSIS — F419 Anxiety disorder, unspecified: Secondary | ICD-10-CM

## 2021-03-22 DIAGNOSIS — J441 Chronic obstructive pulmonary disease with (acute) exacerbation: Principal | ICD-10-CM | POA: Diagnosis present

## 2021-03-22 DIAGNOSIS — Z79899 Other long term (current) drug therapy: Secondary | ICD-10-CM | POA: Diagnosis not present

## 2021-03-22 LAB — CBC WITH DIFFERENTIAL/PLATELET
Abs Immature Granulocytes: 0.03 10*3/uL (ref 0.00–0.07)
Basophils Absolute: 0 10*3/uL (ref 0.0–0.1)
Basophils Relative: 0 %
Eosinophils Absolute: 0.1 10*3/uL (ref 0.0–0.5)
Eosinophils Relative: 2 %
HCT: 39.8 % (ref 36.0–46.0)
Hemoglobin: 13.2 g/dL (ref 12.0–15.0)
Immature Granulocytes: 1 %
Lymphocytes Relative: 25 %
Lymphs Abs: 1.5 10*3/uL (ref 0.7–4.0)
MCH: 32.2 pg (ref 26.0–34.0)
MCHC: 33.2 g/dL (ref 30.0–36.0)
MCV: 97.1 fL (ref 80.0–100.0)
Monocytes Absolute: 0.6 10*3/uL (ref 0.1–1.0)
Monocytes Relative: 10 %
Neutro Abs: 3.7 10*3/uL (ref 1.7–7.7)
Neutrophils Relative %: 62 %
Platelets: 207 10*3/uL (ref 150–400)
RBC: 4.1 MIL/uL (ref 3.87–5.11)
RDW: 13.9 % (ref 11.5–15.5)
WBC: 5.9 10*3/uL (ref 4.0–10.5)
nRBC: 0 % (ref 0.0–0.2)

## 2021-03-22 LAB — I-STAT ARTERIAL BLOOD GAS, ED
Acid-Base Excess: 0 mmol/L (ref 0.0–2.0)
Bicarbonate: 25.3 mmol/L (ref 20.0–28.0)
Calcium, Ion: 1.26 mmol/L (ref 1.15–1.40)
HCT: 41 % (ref 36.0–46.0)
Hemoglobin: 13.9 g/dL (ref 12.0–15.0)
O2 Saturation: 93 %
Potassium: 3.3 mmol/L — ABNORMAL LOW (ref 3.5–5.1)
Sodium: 136 mmol/L (ref 135–145)
TCO2: 27 mmol/L (ref 22–32)
pCO2 arterial: 43.7 mmHg (ref 32–48)
pH, Arterial: 7.371 (ref 7.35–7.45)
pO2, Arterial: 69 mmHg — ABNORMAL LOW (ref 83–108)

## 2021-03-22 LAB — COMPREHENSIVE METABOLIC PANEL
ALT: 57 U/L — ABNORMAL HIGH (ref 0–44)
AST: 90 U/L — ABNORMAL HIGH (ref 15–41)
Albumin: 4.7 g/dL (ref 3.5–5.0)
Alkaline Phosphatase: 150 U/L — ABNORMAL HIGH (ref 38–126)
Anion gap: 13 (ref 5–15)
BUN: 21 mg/dL (ref 8–23)
CO2: 25 mmol/L (ref 22–32)
Calcium: 9.9 mg/dL (ref 8.9–10.3)
Chloride: 98 mmol/L (ref 98–111)
Creatinine, Ser: 0.79 mg/dL (ref 0.44–1.00)
GFR, Estimated: >60 mL/min (ref >60)
Glucose, Bld: 196 mg/dL — ABNORMAL HIGH (ref 70–99)
Potassium: 3.4 mmol/L — ABNORMAL LOW (ref 3.5–5.1)
Sodium: 136 mmol/L (ref 135–145)
Total Bilirubin: 0.7 mg/dL (ref 0.3–1.2)
Total Protein: 8.4 g/dL — ABNORMAL HIGH (ref 6.5–8.1)

## 2021-03-22 LAB — BRAIN NATRIURETIC PEPTIDE: B Natriuretic Peptide: 95 pg/mL (ref 0.0–100.0)

## 2021-03-22 LAB — I-STAT CHEM 8, ED
BUN: 23 mg/dL (ref 8–23)
Calcium, Ion: 1.17 mmol/L (ref 1.15–1.40)
Chloride: 100 mmol/L (ref 98–111)
Creatinine, Ser: 0.7 mg/dL (ref 0.44–1.00)
Glucose, Bld: 195 mg/dL — ABNORMAL HIGH (ref 70–99)
HCT: 44 % (ref 36.0–46.0)
Hemoglobin: 15 g/dL (ref 12.0–15.0)
Potassium: 3.4 mmol/L — ABNORMAL LOW (ref 3.5–5.1)
Sodium: 137 mmol/L (ref 135–145)
TCO2: 27 mmol/L (ref 22–32)

## 2021-03-22 LAB — PROTIME-INR
INR: 1.1 (ref 0.8–1.2)
Prothrombin Time: 13.7 seconds (ref 11.4–15.2)

## 2021-03-22 LAB — RESP PANEL BY RT-PCR (FLU A&B, COVID) ARPGX2
Influenza A by PCR: NEGATIVE
Influenza B by PCR: NEGATIVE
SARS Coronavirus 2 by RT PCR: NEGATIVE

## 2021-03-22 LAB — TROPONIN I (HIGH SENSITIVITY)
Troponin I (High Sensitivity): 12 ng/L (ref <18)
Troponin I (High Sensitivity): 19 ng/L — ABNORMAL HIGH (ref <18)

## 2021-03-22 LAB — LACTIC ACID, PLASMA
Lactic Acid, Venous: 1.4 mmol/L (ref 0.5–1.9)
Lactic Acid, Venous: 1.2 mmol/L (ref 0.5–1.9)

## 2021-03-22 MED ORDER — PANCRELIPASE (LIP-PROT-AMYL) 12000-38000 UNITS PO CPEP
24000.0000 [IU] | ORAL_CAPSULE | Freq: Two times a day (BID) | ORAL | Status: DC
  Administered 2021-03-23: 24000 [IU] via ORAL
  Filled 2021-03-22: qty 2

## 2021-03-22 MED ORDER — UMECLIDINIUM BROMIDE 62.5 MCG/ACT IN AEPB
1.0000 | INHALATION_SPRAY | Freq: Every day | RESPIRATORY_TRACT | Status: DC
  Administered 2021-03-23: 1 via RESPIRATORY_TRACT
  Filled 2021-03-22: qty 7

## 2021-03-22 MED ORDER — IBUPROFEN 400 MG PO TABS: 400.0000 mg | ORAL_TABLET | Freq: Once | ORAL | Status: DC

## 2021-03-22 MED ORDER — BUSPIRONE HCL 5 MG PO TABS
10.0000 mg | ORAL_TABLET | Freq: Three times a day (TID) | ORAL | Status: DC
  Administered 2021-03-22 – 2021-03-23 (×3): 10 mg via ORAL
  Filled 2021-03-22: qty 2
  Filled 2021-03-22: qty 1
  Filled 2021-03-22: qty 2

## 2021-03-22 MED ORDER — ADULT MULTIVITAMIN W/MINERALS CH
1.0000 | ORAL_TABLET | Freq: Every day | ORAL | Status: DC
  Administered 2021-03-23: 1 via ORAL
  Filled 2021-03-22: qty 1

## 2021-03-22 MED ORDER — POTASSIUM CHLORIDE 20 MEQ PO PACK
40.0000 meq | PACK | Freq: Once | ORAL | Status: AC
  Administered 2021-03-22: 40 meq via ORAL
  Filled 2021-03-22: qty 2

## 2021-03-22 MED ORDER — FLUTICASONE FUROATE-VILANTEROL 100-25 MCG/ACT IN AEPB
1.0000 | INHALATION_SPRAY | Freq: Every day | RESPIRATORY_TRACT | Status: DC
  Administered 2021-03-23: 1 via RESPIRATORY_TRACT
  Filled 2021-03-22: qty 28

## 2021-03-22 MED ORDER — ENOXAPARIN SODIUM 30 MG/0.3ML IJ SOSY
30.0000 mg | PREFILLED_SYRINGE | INTRAMUSCULAR | Status: DC
  Administered 2021-03-22: 30 mg via SUBCUTANEOUS
  Filled 2021-03-22: qty 0.3

## 2021-03-22 MED ORDER — CLONIDINE HCL 0.1 MG PO TABS
0.1000 mg | ORAL_TABLET | Freq: Three times a day (TID) | ORAL | Status: DC
  Administered 2021-03-22 – 2021-03-23 (×2): 0.1 mg via ORAL
  Filled 2021-03-22 (×3): qty 1

## 2021-03-22 MED ORDER — PANCRELIPASE (LIP-PROT-AMYL) 25000-79000 UNITS PO CPEP: 1.0000 | ORAL_CAPSULE | Freq: Two times a day (BID) | ORAL | Status: DC

## 2021-03-22 MED ORDER — ACETAMINOPHEN 325 MG PO TABS
650.0000 mg | ORAL_TABLET | Freq: Four times a day (QID) | ORAL | Status: DC | PRN
  Administered 2021-03-23: 650 mg via ORAL
  Filled 2021-03-22 (×2): qty 2

## 2021-03-22 MED ORDER — ACETAMINOPHEN 650 MG RE SUPP: 650.0000 mg | Freq: Four times a day (QID) | RECTAL | Status: DC | PRN

## 2021-03-22 MED ORDER — PREDNISONE 20 MG PO TABS
40.0000 mg | ORAL_TABLET | Freq: Every day | ORAL | Status: DC
Start: 2021-03-23 — End: 2021-03-23
  Administered 2021-03-23: 40 mg via ORAL
  Filled 2021-03-22: qty 2

## 2021-03-22 MED ORDER — PANTOPRAZOLE SODIUM 40 MG PO TBEC
40.0000 mg | DELAYED_RELEASE_TABLET | Freq: Every day | ORAL | Status: DC
  Administered 2021-03-23: 40 mg via ORAL
  Filled 2021-03-22: qty 1

## 2021-03-22 MED ORDER — IPRATROPIUM-ALBUTEROL 0.5-2.5 (3) MG/3ML IN SOLN
3.0000 mL | RESPIRATORY_TRACT | Status: DC | PRN
  Administered 2021-03-22: 3 mL via RESPIRATORY_TRACT
  Filled 2021-03-22: qty 3

## 2021-03-22 MED ORDER — NICOTINE 21 MG/24HR TD PT24
21.0000 mg | MEDICATED_PATCH | Freq: Every day | TRANSDERMAL | Status: DC
  Administered 2021-03-22 – 2021-03-23 (×2): 21 mg via TRANSDERMAL
  Filled 2021-03-22 (×2): qty 1

## 2021-03-22 MED ORDER — CARVEDILOL 3.125 MG PO TABS
3.1250 mg | ORAL_TABLET | Freq: Two times a day (BID) | ORAL | Status: DC
  Administered 2021-03-22 – 2021-03-23 (×2): 3.125 mg via ORAL
  Filled 2021-03-22 (×2): qty 1

## 2021-03-22 NOTE — Progress Notes (Signed)
New Admission Note:  Arrival Method: Stretcher Mental Orientation: Alert and oriented x 4 Telemetry: N/A Assessment: Completed Skin: Warm and dry IV: NSL Pain: Denies Tubes: N/A Safety Measures: Safety Fall Prevention Plan initiated.  Admission: Completed 5 M  Orientation: Patient has been orientated to the room, unit and the staff. Welcome booklet given.  Family: N/A  Orders have been reviewed and implemented. Will continue to monitor the patient. Call light has been placed within reach and bed alarm has been activated.   Clayton Bosserman BSN, RN  Phone Number: [phone] 

## 2021-03-22 NOTE — Plan of Care (Signed)
  Problem: Education: Goal: Knowledge of General Education information will improve Description Including pain rating scale, medication(s)/side effects and non-pharmacologic comfort measures Outcome: Progressing   

## 2021-03-22 NOTE — ED Provider Notes (Signed)
[facility] EMERGENCY DEPARTMENT Provider Note   CSN: [id number] Arrival date & time: 03/22/21  1202     History  Chief Complaint  Patient presents with   Respiratory Distress    Cristina Myers is a 61 y.o. female.  61 year old female with prior medical history as detailed below presents for evaluation.  Patient arrives by EMS.  Patient reports that she was moving furniture around her house when she began to feel very short of breath.  She reports significant wheezing.  EMS reports that the patient was 60% on room air on their arrival.  Patient was given multiple breathing treatments while in route.  She is given 125 mg of Solu-Medrol prior to arrival by EMS.  Upon arrival patient does feel improved.  She is still wheezing.  She is able to speak in full sentences.  On 2 L nasal cannula she has saturations in the mid 90s.  She is moving air well.  Patient reports that she stopped smoking today.  The history is provided by the patient and medical records.  Illness Location:  Wheezing, shortness of breath Severity:  Moderate Onset quality:  Sudden Duration:  2 hours Timing:  Constant Progression:  Worsening Chronicity:  New     Home Medications Prior to Admission medications   Medication Sig Start Date End Date Taking? Authorizing Provider  albuterol (VENTOLIN HFA) 108 (90 Base) MCG/ACT inhaler Inhale 2 puffs into the lungs every 6 (six) hours as needed for wheezing or shortness of breath. 03/17/21  Yes [provider]  amphetamine-dextroamphetamine (ADDERALL) 30 MG tablet Take 1 tablet by mouth 2 (two) times daily as needed Patient taking differently: Take 30 mg by mouth daily. 03/21/21  Yes   busPIRone (BUSPAR) 10 MG tablet Take 1 tablet (10 mg total) by mouth 3 (three) times daily. 03/21/21  Yes   carvedilol (COREG) 3.125 MG tablet Take 3.125 mg by mouth 2 (two) times daily. 03/17/21  Yes [provider]  cloNIDine (CATAPRES) 0.1 MG tablet  Take 0.1 mg by mouth 3 (three) times daily. 03/17/21  Yes [provider]  Multiple Vitamin (MULTIVITAMIN ADULT) TABS Take 1 tablet by mouth daily.   Yes [provider]  RABEprazole (ACIPHEX) 20 MG tablet Take 20 mg by mouth in the morning and at bedtime. 01/15/21  Yes [provider]  traZODone (DESYREL) 100 MG tablet Take 1 tablet (100 mg total) by mouth nightly Patient taking differently: Take 100 mg by mouth at bedtime as needed for sleep. 03/18/21  Yes   TRELEGY ELLIPTA 100-62.5-25 MCG/ACT AEPB Inhale 1 puff into the lungs daily. 12/31/20  Yes [provider]  ZENPEP 25000-79000 units CPEP Take 1 capsule by mouth in the morning and at bedtime. 12/30/20  Yes [provider]  amphetamine-dextroamphetamine (ADDERALL) 20 MG tablet Take 1 tablet (20 mg total) by mouth daily as needed. Patient not taking: Reported on 03/22/2021 12/04/20     amphetamine-dextroamphetamine (ADDERALL) 20 MG tablet Take 1 Tablet daily as needed Patient not taking: Reported on 03/22/2021 12/09/20     amphetamine-dextroamphetamine (ADDERALL) 20 MG tablet Take 1 tablet (20 mg total) by mouth 2 (two) times daily as needed. Patient not taking: Reported on 03/22/2021 12/13/20     amphetamine-dextroamphetamine (ADDERALL) 20 MG tablet Take 1 Tablet by mouth two times daily, as needed Patient not taking: Reported on 03/22/2021 01/02/21     amphetamine-dextroamphetamine (ADDERALL) 30 MG tablet Take 1 tablet by mouth 2 (two) times daily as needed. Patient not  taking: Reported on 03/22/2021 02/19/21     budesonide-formoterol (SYMBICORT) 80-4.5 MCG/ACT inhaler Inhale 2 puffs into the lungs 2 (two) times daily. Patient not taking: Reported on 09/02/2017 06/01/17   Nyoka Cowden, MD      Allergies    Bee venom and Chantix [varenicline]    Review of Systems   Review of Systems  All other systems reviewed and are negative.  Physical Exam Updated Vital Signs BP (!) 164/109    Pulse 92    Temp  97.9 F (36.6 C) (Oral)    Resp (!) 23    Ht 5\' 3"  (1.6 m)    Wt 58.5 kg    SpO2 97%    BMI 22.85 kg/m  Physical Exam Vitals and nursing note reviewed.  Constitutional:      General: She is in acute distress.     Appearance: Normal appearance. She is well-developed.  HENT:     Head: Normocephalic and atraumatic.  Eyes:     Conjunctiva/sclera: Conjunctivae normal.     Pupils: Pupils are equal, round, and reactive to light.  Cardiovascular:     Rate and Rhythm: Normal rate and regular rhythm.     Heart sounds: Normal heart sounds.  Pulmonary:     Effort: Pulmonary effort is normal. No respiratory distress.     Comments: Diffuse bilateral expiratory wheezing in all lung fields Abdominal:     General: There is no distension.     Palpations: Abdomen is soft.     Tenderness: There is no abdominal tenderness.  Musculoskeletal:        General: No deformity. Normal range of motion.     Cervical back: Normal range of motion and neck supple.  Skin:    General: Skin is warm and dry.  Neurological:     General: No focal deficit present.     Mental Status: She is alert and oriented to person, place, and time.    ED Results / Procedures / Treatments   Labs (all labs ordered are listed, but only abnormal results are displayed) Labs Reviewed  COMPREHENSIVE METABOLIC PANEL - Abnormal; Notable for the following components:      Result Value   Potassium 3.4 (*)    Glucose, Bld 196 (*)    Total Protein 8.4 (*)    AST 90 (*)    ALT 57 (*)    Alkaline Phosphatase 150 (*)    All other components within normal limits  I-STAT CHEM 8, ED - Abnormal; Notable for the following components:   Potassium 3.4 (*)    Glucose, Bld 195 (*)    All other components within normal limits  I-STAT ARTERIAL BLOOD GAS, ED - Abnormal; Notable for the following components:   pO2, Arterial 69 (*)    Potassium 3.3 (*)    All other components within normal limits  RESP PANEL BY RT-PCR (FLU A&B, COVID) [id number]   CULTURE, BLOOD (ROUTINE X 2)  CULTURE, BLOOD (ROUTINE X 2)  LACTIC ACID, PLASMA  CBC WITH DIFFERENTIAL/PLATELET  BRAIN NATRIURETIC PEPTIDE  PROTIME-INR  LACTIC ACID, PLASMA  TROPONIN I (HIGH SENSITIVITY)  TROPONIN I (HIGH SENSITIVITY)    EKG EKG Interpretation  Date/Time:  Saturday March 22 2021 12:08:34 EST Ventricular Rate:  106 PR Interval:  159 QRS Duration: 88 QT Interval:  371 QTC Calculation: 493 R Axis:   74 Text Interpretation: Sinus tachycardia Atrial premature complex RAE, consider biatrial enlargement Borderline prolonged QT interval Confirmed by 10-27-1996 [phone]) on 03/22/2021  12:12:57 PM  Radiology DG Chest Port 1 View  Result Date: 03/22/2021 CLINICAL DATA:  Shortness of breath. EXAM: PORTABLE CHEST 1 VIEW COMPARISON:  10/09/2020 FINDINGS: No consolidation. No visible pleural effusions or pneumothorax. Similar cardiomediastinal silhouette when accounting for patient rotation. No evidence of acute osseous abnormality. IMPRESSION: No evidence of acute cardiopulmonary disease. Electronically Signed   By: Feliberto Harts M.D.   On: 03/22/2021 12:38    Procedures Procedures    Medications Ordered in ED Medications - No data to display  ED Course/ Medical Decision Making/ A&P                           Medical Decision Making Amount and/or Complexity of Data Reviewed Labs: ordered. Radiology: ordered.  Risk Decision regarding hospitalization.    Medical Screen Complete  This patient presented to the ED with complaint of shortness of breath, wheezing.  This complaint involves an extensive number of treatment options. The initial differential diagnosis includes, but is not limited to, COPD exacerbation, pneumonia, ACS, metabolic abnormality  This presentation is: Acute, Chronic, Self-Limited, Previously Undiagnosed, Uncertain Prognosis, Complicated, Systemic Symptoms, and Threat to Life/Bodily Function  Patient presents with complaint of  shortness of breath.  Patient's presentation is consistent with COPD exacerbation  EMS treatment with Solu-Medrol and multiple breathing treatments has improved the patient significantly.  Patient would benefit from admission for further work-up and treatment.  Co morbidities that complicated the patient's evaluation  COPD   Additional history obtained:  Additional history obtained from EMS External records from outside sources obtained and reviewed including prior ED visits and prior Inpatient records.    Lab Tests:  I ordered and personally interpreted labs.  The pertinent results include: CBC, CMP, blood cultures, troponin, BNP, COVID, flu   Imaging Studies ordered:  I ordered imaging studies including chest x-ray I independently visualized and interpreted obtained imaging which showed NAD I agree with the radiologist interpretation.   Cardiac Monitoring:  The patient was maintained on a cardiac monitor.  I personally viewed and interpreted the cardiac monitor which showed an underlying rhythm of: Sinus tach   Problem List / ED Course:  COPD exacerbation   Reevaluation:  After the interventions noted above, I reevaluated the patient and found that they have: improved    Disposition:  After consideration of the diagnostic results and the patients response to treatment, I feel that the patent would benefit from admission.   CRITICAL CARE Performed by: Wynetta Fines   Total critical care time: 30 minutes  Critical care time was exclusive of separately billable procedures and treating other patients.  Critical care was necessary to treat or prevent imminent or life-threatening deterioration.  Critical care was time spent personally by me on the following activities: development of treatment plan with patient and/or surrogate as well as nursing, discussions with consultants, evaluation of patient's response to treatment, examination of patient, obtaining  history from patient or surrogate, ordering and performing treatments and interventions, ordering and review of laboratory studies, ordering and review of radiographic studies, pulse oximetry and re-evaluation of patient's condition.         Final Clinical Impression(s) / ED Diagnoses Final diagnoses:  COPD exacerbation (HCC)    Rx / DC Orders ED Discharge Orders     None         Wynetta Fines, MD 03/22/21 1529

## 2021-03-22 NOTE — H&P (Signed)
Date: 03/22/2021               Patient Name:  Cristina Myers MRN: [id number]  DOB: 02-May-1960 Age / Sex: 61 y.o., female   PCP: Eliezer Lofts, MD         Medical Service: Internal Medicine Teaching Service         Attending Physician: Dr. Gust Rung, DO    First Contact: Adron Bene, MD Pager: [place] [phone]  Second Contact: Marolyn Haller, MD Pager: [phone]       After Hours (After 5p/  First Contact Pager: [phone]  weekends / holidays): Second Contact Pager: [phone]   SUBJECTIVE   Chief Complaint: shortness of breath  History of Present Illness:  61 year old woman with a hx of hypertension and COPD who presented with complaints of shortness of breath and admitted for hypoxemia.  She reports that she was moving boxes/cleaning last night and earlier this morning when she had a progressive and quick onset of shortness of breath. She has had a similar event happen in the past and reports that her blood pressure was in the 200's during that event. She tried to check her blood pressure but the batteries in her cuff were dead so she used her albuterol inhaler instead with no improvement. Has not had any change in cough or sputum, nor has she felt feverish, had chills, headache, blurry vision, or any chest pain at the time of the event earlier. Shortness of breath did not improve with rescue inhaler so EMS was called. Her blood pressure was noted to be in the 200's upon EMS arrival. Says BP normally 130/80. She does take coreg and clonidine for blood pressure control but states that she has been compliant and has not missed her doses of these medications. EMS administered solumedrol since she was wheezing on their exam with saturations 60% on room air. She also received several breathing treatments while en route with improvement by the time she arrived in the ED.    ED Course: CXR, troponin, and BNP obtained in ED all unremarkable. During evaluation in the ED, the patient reported  significant improvement since arrival. She was speaking in full sentences and was breathing well on room air with O2 saturations in mid 90's.   Meds:  Current Meds  Medication Sig   albuterol (VENTOLIN HFA) 108 (90 Base) MCG/ACT inhaler Inhale 2 puffs into the lungs every 6 (six) hours as needed for wheezing or shortness of breath.   amphetamine-dextroamphetamine (ADDERALL) 30 MG tablet Take 1 tablet by mouth 2 (two) times daily as needed (Patient taking differently: Take 30 mg by mouth daily.)   busPIRone (BUSPAR) 10 MG tablet Take 1 tablet (10 mg total) by mouth 3 (three) times daily.   carvedilol (COREG) 3.125 MG tablet Take 3.125 mg by mouth 2 (two) times daily.   cloNIDine (CATAPRES) 0.1 MG tablet Take 0.1 mg by mouth 3 (three) times daily.   Multiple Vitamin (MULTIVITAMIN ADULT) TABS Take 1 tablet by mouth daily.   RABEprazole (ACIPHEX) 20 MG tablet Take 20 mg by mouth in the morning and at bedtime.   traZODone (DESYREL) 100 MG tablet Take 1 tablet (100 mg total) by mouth nightly (Patient taking differently: Take 100 mg by mouth at bedtime as needed for sleep.)   TRELEGY ELLIPTA 100-62.5-25 MCG/ACT AEPB Inhale 1 puff into the lungs daily.   ZENPEP 25000-79000 units CPEP Take 1 capsule by mouth in the morning and at bedtime.  Past Medical History:  Diagnosis Date   ADHD (attention deficit hyperactivity disorder)    Hattie Perch 09/17/2014   Anxiety state, unspecified    Arthritis    "hands, back" (09/17/2014)   Attention deficit disorder without mention of hyperactivity    meds per psych as of 12/2009   Backache, unspecified    MRI lumbar region, dessicam disc's L3/4, L4/5: 5/S1. 05/2006.   Barrett's esophagus    EGD 03/14/03.  EGD- prox to mid esophagus 10/12/06   Carpal tunnel syndrome    bilateral   Cervicalgia    with degenerative changes from C3- C7, MRI 05/2006. Narrowing   Chronic prescription benzodiazepine use    /notes 09/17/2014   Constipation, chronic    history of    Diverticulosis    Elevated blood pressure reading without diagnosis of hypertension    Family history of breast cancer    GERD (gastroesophageal reflux disease)    Hypertension    Left knee pain    Miscarriage    1994   NSVD (normal spontaneous vaginal delivery)    x 4- 81, 83, 91, 98   Other dermatitis due to solar radiation    Pure hypercholesterolemia    Seizures (HCC)    "when I was real young; never medicated for them; went away"   Thumb pain    bilateral   Tobacco use disorder     Past Surgical History:  Procedure Laterality Date   COLONOSCOPY     ESOPHAGOGASTRODUODENOSCOPY     NO PAST SURGERIES      Social:  Lives With: husband Occupation: none Support: family Level of Function: independent PCP:  Substances: tobacco, alcohol  Family History:   Allergies: Allergies as of 03/22/2021 - Review Complete 03/22/2021  Allergen Reaction Noted   Bee venom Swelling 05/03/2017   Chantix [varenicline] Other (See Comments) 11/04/2011    Review of Systems: A complete ROS was negative except as per HPI.   OBJECTIVE:   Physical Exam: Blood pressure (!) 187/123, pulse 89, temperature 97.9 F (36.6 C), temperature source Oral, resp. rate 20, height 5\' 3"  (1.6 m), weight 58.5 kg, SpO2 96 %.  Constitutional: well-appearing female sitting in bed, in no acute distress HENT: normocephalic atraumatic, mucous membranes moist, edentulous Eyes: conjunctiva non-erythematous Neck: supple Cardiovascular: regular rate and rhythm, no m/r/g Pulmonary/Chest: normal work of breathing on room air, faint wheeze throughout all lung fields Abdominal: soft, non-tender, non-distended MSK: normal bulk and tone Neurological: alert & oriented x 3, 5/5 strength in bilateral upper and lower extremities Skin: warm and dry Psych: mood and affect appropriate  Labs: CBC    Component Value Date/Time   WBC 5.9 03/22/2021 1220   RBC 4.10 03/22/2021 1220   HGB 15.0 03/22/2021 1244   HCT 44.0  03/22/2021 1244   PLT 207 03/22/2021 1220   MCV 97.1 03/22/2021 1220   MCH 32.2 03/22/2021 1220   MCHC 33.2 03/22/2021 1220   RDW 13.9 03/22/2021 1220   LYMPHSABS 1.5 03/22/2021 1220   MONOABS 0.6 03/22/2021 1220   EOSABS 0.1 03/22/2021 1220   BASOSABS 0.0 03/22/2021 1220     CMP     Component Value Date/Time   NA 137 03/22/2021 1244   K 3.4 (L) 03/22/2021 1244   CL 100 03/22/2021 1244   CO2 25 03/22/2021 1220   GLUCOSE 195 (H) 03/22/2021 1244   BUN 23 03/22/2021 1244   CREATININE 0.70 03/22/2021 1244   CALCIUM 9.9 03/22/2021 1220   PROT 8.4 (H) 03/22/2021 1220  ALBUMIN 4.7 03/22/2021 1220   AST 90 (H) 03/22/2021 1220   ALT 57 (H) 03/22/2021 1220   ALKPHOS 150 (H) 03/22/2021 1220   BILITOT 0.7 03/22/2021 1220   GFRNONAA >60 03/22/2021 1220   GFRAA >60 09/18/2014 0705    Imaging: DG Chest Port 1 View  Result Date: 03/22/2021 CLINICAL DATA:  Shortness of breath. EXAM: PORTABLE CHEST 1 VIEW COMPARISON:  10/09/2020 FINDINGS: No consolidation. No visible pleural effusions or pneumothorax. Similar cardiomediastinal silhouette when accounting for patient rotation. No evidence of acute osseous abnormality. IMPRESSION: No evidence of acute cardiopulmonary disease. Electronically Signed   By: Feliberto Harts M.D.   On: 03/22/2021 12:38    EKG: personally reviewed my interpretation is sinus tachycardia   ASSESSMENT & PLAN:    Assessment & Plan by Problem: Principal Problem:   COPD with acute exacerbation (HCC)  Dyspnea Hx COPD - etiology unclear, differentials include COPD exacerbation, reactive airway disease, symptomatic hypertension, anxiety. Her symptoms improved with breathing treatments and steroids suggesting COPD vs reactive airway. She does have a hx of COPD with significant smoking history, however, rapid onset of symptoms and no change in cough or sputum production argue against this. Flash pulmonary edema in the setting of HTN less likely given clear CXR. PE also  unlike given absence of CP.  - Dyspnea appears to have resolved after breathing treatments and solumedrol. She is breathing comfortably on RA with saturation in mid 90's. No accessory muscle use for respiration. Wheezing present but air movement adequate - continue prednisone 40mg , incruse ellipta, and duonebs. White count has been normal and she is afebrile. No indication for antibiotics at this time.   HTN ? Symptomatic hypertension - Initially elevated to 200's prior to arrival. Blood pressures have improved but remain elevated in the emergency department. Patient takes clonidine and coreg as an outpatient. She denies missing doses today. Will plan to continue clonidine during hospitalization to prevent rebound hypertension. Will also plan to continue Coreg.  Alcohol use - reports 2-3 drinks daily. Can monitor with CIWA, no ativan at this time.   Anxiety - buspar 10mg  TID  Tobacco use - patient reports she quick smoking today. Can give nicotine patch PRN.  Code: Full Dispo: Admit patient to Observation with expected length of stay less than 2 midnights.  Signed: Adron Bene, MD Internal Medicine Resident PGY-1 Pager: [phone]  03/22/2021, 6:55 PM

## 2021-03-22 NOTE — ED Notes (Signed)
IM resident paged re: pain med, BP management.

## 2021-03-22 NOTE — ED Notes (Signed)
The pt refused the clonidine  she reports that she was told by admitting thbat they were  not going to  give this drug

## 2021-03-22 NOTE — ED Notes (Signed)
IM Dr. Neal Dy returned age, team will see, and address pain and BP.

## 2021-03-22 NOTE — ED Triage Notes (Signed)
To ED via [facility] from home -- pt called 911 due to shortness of breath, on arrival-- resp labored but not as bad as when EMS got to her house. - initial resp were 50 for EMS, with a sat of 60%-  Given 4 albuterol treatments- total 10mg  Solumedrol 125MG  IV  IV 20G right AC.

## 2021-03-23 DIAGNOSIS — I10 Essential (primary) hypertension: Secondary | ICD-10-CM | POA: Diagnosis not present

## 2021-03-23 DIAGNOSIS — F419 Anxiety disorder, unspecified: Secondary | ICD-10-CM | POA: Diagnosis not present

## 2021-03-23 DIAGNOSIS — J441 Chronic obstructive pulmonary disease with (acute) exacerbation: Secondary | ICD-10-CM | POA: Diagnosis not present

## 2021-03-23 DIAGNOSIS — F1010 Alcohol abuse, uncomplicated: Secondary | ICD-10-CM | POA: Diagnosis not present

## 2021-03-23 LAB — CBC
HCT: 39.4 % (ref 36.0–46.0)
Hemoglobin: 12.8 g/dL (ref 12.0–15.0)
MCH: 31.3 pg (ref 26.0–34.0)
MCHC: 32.5 g/dL (ref 30.0–36.0)
MCV: 96.3 fL (ref 80.0–100.0)
Platelets: 235 10*3/uL (ref 150–400)
RBC: 4.09 MIL/uL (ref 3.87–5.11)
RDW: 13.9 % (ref 11.5–15.5)
WBC: 4.3 10*3/uL (ref 4.0–10.5)
nRBC: 0 % (ref 0.0–0.2)

## 2021-03-23 LAB — BASIC METABOLIC PANEL
Anion gap: 13 (ref 5–15)
BUN: 30 mg/dL — ABNORMAL HIGH (ref 8–23)
CO2: 22 mmol/L (ref 22–32)
Calcium: 10.3 mg/dL (ref 8.9–10.3)
Chloride: 99 mmol/L (ref 98–111)
Creatinine, Ser: 1.04 mg/dL — ABNORMAL HIGH (ref 0.44–1.00)
GFR, Estimated: >60 mL/min (ref >60)
Glucose, Bld: 168 mg/dL — ABNORMAL HIGH (ref 70–99)
Potassium: 4.2 mmol/L (ref 3.5–5.1)
Sodium: 134 mmol/L — ABNORMAL LOW (ref 135–145)

## 2021-03-23 LAB — HIV ANTIBODY (ROUTINE TESTING W REFLEX): HIV Screen 4th Generation wRfx: NONREACTIVE

## 2021-03-23 MED ORDER — PREDNISONE 20 MG PO TABS: 20.0000 mg | ORAL_TABLET | Freq: Every day | ORAL | 0 refills | Status: AC

## 2021-03-23 MED ORDER — LOSARTAN POTASSIUM 25 MG PO TABS: 25.0000 mg | ORAL_TABLET | Freq: Every day | ORAL | 0 refills | Status: DC

## 2021-03-23 MED ORDER — BENZONATATE 100 MG PO CAPS
100.0000 mg | ORAL_CAPSULE | Freq: Three times a day (TID) | ORAL | Status: DC | PRN
  Administered 2021-03-23: 100 mg via ORAL
  Filled 2021-03-23: qty 1

## 2021-03-23 NOTE — Discharge Summary (Signed)
Name: Cristina Myers MRN: [id number] DOB: 1960-06-03 61 y.o. PCP: Cristina Sellar, MD  Date of Admission: 03/22/2021 12:02 PM Date of Discharge: 03/23/21 Attending Physician: Dr. Heber Edgewood  Discharge Diagnosis: Principal Problem:   COPD with acute exacerbation [facility])    Discharge Medications: Allergies as of 03/23/2021       Reactions   Bee Venom Swelling   Chantix [varenicline] Other (See Comments)   Mood swings, "more emotional"        Medication List     STOP taking these medications    budesonide-formoterol 80-4.5 MCG/ACT inhaler Commonly known as: Symbicort       TAKE these medications    albuterol 108 (90 Base) MCG/ACT inhaler Commonly known as: VENTOLIN HFA Inhale 2 puffs into the lungs every 6 (six) hours as needed for wheezing or shortness of breath.   amphetamine-dextroamphetamine 30 MG tablet Commonly known as: ADDERALL Take 1 tablet by mouth 2 (two) times daily as needed What changed:  when to take this Another medication with the same name was removed. Continue taking this medication, and follow the directions you see here.   busPIRone 10 MG tablet Commonly known as: BUSPAR Take 1 tablet (10 mg total) by mouth 3 (three) times daily.   carvedilol 3.125 MG tablet Commonly known as: COREG Take 3.125 mg by mouth 2 (two) times daily.   cloNIDine 0.1 MG tablet Commonly known as: CATAPRES Take 0.1 mg by mouth 3 (three) times daily.   losartan 25 MG tablet Commonly known as: Cozaar Take 1 tablet (25 mg total) by mouth daily.   Multivitamin Adult Tabs Take 1 tablet by mouth daily.   predniSONE 20 MG tablet Commonly known as: DELTASONE Take 1 tablet (20 mg total) by mouth daily with breakfast for 4 days. Start taking on: March 24, 2021   RABEprazole 20 MG tablet Commonly known as: ACIPHEX Take 20 mg by mouth in the morning and at bedtime.   traZODone 100 MG tablet Commonly known as: DESYREL Take 1 tablet (100 mg total) by mouth  nightly What changed:  when to take this reasons to take this   Trelegy Ellipta 100-62.5-25 MCG/ACT Aepb Generic drug: Fluticasone-Umeclidin-Vilant Inhale 1 puff into the lungs daily.   Zenpep 25000-79000 units Cpep Generic drug: Pancrelipase (Lip-Prot-Amyl) Take 1 capsule by mouth in the morning and at bedtime.        Disposition and follow-up:   Cristina Myers was discharged from [facility] in Stable condition.  At the hospital follow up visit please address:  1.  Follow-up:  a. COPD exacerbation mild - started on prednisone in the hospital. Discharged on 20mg  prednisone to take daily.     b. HTN - blood pressures elevated throughout hospitalization. She was not taking her lisinopril. Lisinopril restarted.    2.  Labs / imaging needed at time of follow-up: none  3.  Pending labs/ test needing follow-up: bmp  4.  Medication Changes  Started:  Stopped:  Changed:  Abx -   End Date:  Follow-up Appointments:  Follow-up Information     Cristina Sellar, MD .   Specialty: General Practice Contact information: [place] [id number] [phone]                 Hospital Course by problem list:   COPD exacerbation - patient presented with complaints of shortness of breath. She has a history of COPD and was treated by EMS prior to arrival with breathing treatments and  solumedrol for wheezing and O2 saturations in the 60's. During evaluation in the ED, symptoms ha largely resolved. She was breathing comfortably on room air and was not requiring supplemental oxygen. No evidence of change in sputum production or worsening cough so she was not started on antibiotics. She was given prednisone 40mg . Symptoms had completely resolved the day after admission and she was felt stable for discharge. She was given a 4 day course of 20mg  steroids to complete upon discharge.  HTN - patient was noted to have elevated blood pressures  during her hospitalization. She reported that BP was in the 200's prior to arrival. She was continued on her home meds Clonidine and Coreg. BP was noted to be consistently elevated, however she was asymptomatic. Due to persistently elevated BP's she was also given Lisinopril which, although on her medication list, she reports she was not currently taking.   Discharge Subjective: States she is feeling well. No complaints at this time. Excited to discharge so that she can visit her husband.   Discharge Exam:   BP (!) 186/114 (BP Location: Right Arm)    Pulse 94    Temp 98.6 F (37 C) (Oral)    Resp 16    Ht 5\' 3"  (1.6 m)    Wt 58.5 kg    SpO2 98%    BMI 22.85 kg/m  Constitutional: well-appearing female sitting in bed, in no acute distress HENT: normocephalic atraumatic, mucous membranes moist Eyes: conjunctiva non-erythematous Neck: supple Cardiovascular: regular rate and rhythm, no m/r/g Pulmonary/Chest: normal work of breathing on room air, lungs clear to auscultation bilaterally Abdominal: soft, non-tender, non-distended MSK: normal bulk and tone Neurological: alert & oriented x 3, 5/5 strength in bilateral upper and lower extremities, normal gait Skin: warm and dry Psych: speech pressured, not responding to internal stimuli, responds appropriately to questioning    Pertinent Labs, Studies, and Procedures:  CBC Latest Ref Rng & Units 03/23/2021 03/22/2021 03/22/2021  WBC 4.0 - 10.5 K/uL 4.3 - 5.9  Hemoglobin 12.0 - 15.0 g/dL 12.8 15.0 13.2  Hematocrit 36.0 - 46.0 % 39.4 44.0 39.8  Platelets 150 - 400 K/uL 235 - 207    CMP Latest Ref Rng & Units 03/23/2021 03/22/2021 03/22/2021  Glucose 70 - 99 mg/dL 168(H) 195(H) 196(H)  BUN 8 - 23 mg/dL 30(H) 23 21  Creatinine 0.44 - 1.00 mg/dL 1.04(H) 0.70 0.79  Sodium 135 - 145 mmol/L 134(L) 137 136  Potassium 3.5 - 5.1 mmol/L 4.2 3.4(L) 3.4(L)  Chloride 98 - 111 mmol/L 99 100 98  CO2 22 - 32 mmol/L 22 - 25  Calcium 8.9 - 10.3 mg/dL 10.3 - 9.9   Total Protein 6.5 - 8.1 g/dL - - 8.4(H)  Total Bilirubin 0.3 - 1.2 mg/dL - - 0.7  Alkaline Phos 38 - 126 U/L - - 150(H)  AST 15 - 41 U/L - - 90(H)  ALT 0 - 44 U/L - - 57(H)    DG Chest Port 1 View  Result Date: 03/22/2021 CLINICAL DATA:  Shortness of breath. EXAM: PORTABLE CHEST 1 VIEW COMPARISON:  10/09/2020 FINDINGS: No consolidation. No visible pleural effusions or pneumothorax. Similar cardiomediastinal silhouette when accounting for patient rotation. No evidence of acute osseous abnormality. IMPRESSION: No evidence of acute cardiopulmonary disease. Electronically Signed   By: Margaretha Sheffield M.D.   On: 03/22/2021 12:38     Discharge Instructions: Discharge Instructions     Call MD for:  difficulty breathing, headache or visual disturbances   Complete by: As  directed    Call MD for:  persistant dizziness or light-headedness   Complete by: As directed    Call MD for:  temperature >100.4   Complete by: As directed    Diet - low sodium heart healthy   Complete by: As directed        Signed: Delene Ruffini, MD 03/23/2021, 9:17 PM   Pager: [phone]

## 2021-03-23 NOTE — Care Management Obs Status (Signed)
[place] NOTIFICATION   Patient Details  Name: Cristina Myers MRN: [id number] Date of Birth: Dec 10, 1960   Medicare Observation Status Notification Given:  Yes    Bartholomew Crews, RN 03/23/2021, 12:13 PM

## 2021-03-23 NOTE — Discharge Instructions (Signed)
Ms. Madrazo it was a pleasure assisting in your care.  We have continued all of your home medications. Please continue to wean your tobacco use. Starting a medicine like Chantix would be ideal if you can tolerate this. We also started you on a medicine that you had previously been on called losartan and give you 4 days of prednisone to help with your breathing. If you have any questions please call your primary care doctor. Please also schedule an appointment with her as soon as possible.

## 2021-03-23 NOTE — Progress Notes (Signed)
DISCHARGE NOTE HOME Cristina Myers to be discharged Home per MD order. Discussed prescriptions and follow up appointments with the patient. Prescriptions given to patient; medication list explained in detail. Patient verbalized understanding.  Skin clean, dry and intact without evidence of skin break down, no evidence of skin tears noted. IV catheter discontinued intact. Site without signs and symptoms of complications. Dressing and pressure applied. Pt denies pain at the site currently. No complaints noted.  Patient free of lines, drains, and wounds.   An After Visit Summary (AVS) was printed and given to the patient. Patient escorted via wheelchair, and discharged home via private auto.  Velia Meyer, RN

## 2021-03-25 ENCOUNTER — Other Ambulatory Visit (HOSPITAL_COMMUNITY): Payer: Self-pay

## 2021-03-25 DIAGNOSIS — R0902 Hypoxemia: Secondary | ICD-10-CM | POA: Insufficient documentation

## 2021-03-25 MED ORDER — RABEPRAZOLE SODIUM 20 MG PO TBEC
20.0000 mg | DELAYED_RELEASE_TABLET | Freq: Two times a day (BID) | ORAL | 1 refills | Status: AC
  Filled 2021-03-25: qty 180, 90d supply, fill #0

## 2021-03-27 LAB — CULTURE, BLOOD (ROUTINE X 2)
Culture: NO GROWTH
Culture: NO GROWTH

## 2021-03-31 ENCOUNTER — Other Ambulatory Visit (HOSPITAL_COMMUNITY): Payer: Self-pay

## 2021-04-02 ENCOUNTER — Other Ambulatory Visit (HOSPITAL_COMMUNITY): Payer: Self-pay

## 2021-04-02 MED ORDER — AMPHETAMINE-DEXTROAMPHETAMINE 30 MG PO TABS
30.0000 mg | ORAL_TABLET | Freq: Two times a day (BID) | ORAL | 0 refills | Status: DC | PRN
  Filled 2021-04-02: qty 60, 30d supply, fill #0

## 2021-04-02 MED ORDER — BUSPIRONE HCL 10 MG PO TABS
10.0000 mg | ORAL_TABLET | Freq: Three times a day (TID) | ORAL | 2 refills | Status: DC
  Filled 2021-04-02: qty 90, 30d supply, fill #0

## 2021-05-01 ENCOUNTER — Other Ambulatory Visit (HOSPITAL_COMMUNITY): Payer: Self-pay

## 2021-05-01 MED ORDER — AMPHETAMINE-DEXTROAMPHETAMINE 30 MG PO TABS
30.0000 mg | ORAL_TABLET | Freq: Two times a day (BID) | ORAL | 0 refills | Status: DC | PRN
  Filled 2021-05-01: qty 60, 30d supply, fill #0

## 2021-06-05 ENCOUNTER — Other Ambulatory Visit (HOSPITAL_COMMUNITY): Payer: Self-pay

## 2021-06-05 MED ORDER — AMPHETAMINE-DEXTROAMPHETAMINE 30 MG PO TABS
1.0000 | ORAL_TABLET | Freq: Two times a day (BID) | ORAL | 0 refills | Status: DC | PRN
  Filled 2021-06-05: qty 60, 30d supply, fill #0

## 2021-06-05 MED ORDER — BUSPIRONE HCL 10 MG PO TABS
10.0000 mg | ORAL_TABLET | Freq: Three times a day (TID) | ORAL | 0 refills | Status: DC
  Filled 2021-06-05: qty 90, 30d supply, fill #0

## 2021-06-06 ENCOUNTER — Other Ambulatory Visit (HOSPITAL_COMMUNITY): Payer: Self-pay

## 2021-07-09 ENCOUNTER — Other Ambulatory Visit (HOSPITAL_COMMUNITY): Payer: Self-pay

## 2021-07-09 MED ORDER — AMPHETAMINE-DEXTROAMPHETAMINE 30 MG PO TABS
30.0000 mg | ORAL_TABLET | Freq: Two times a day (BID) | ORAL | 0 refills | Status: DC | PRN
  Filled 2021-07-09 (×2): qty 60, 30d supply, fill #0

## 2021-07-09 MED ORDER — AMPHETAMINE-DEXTROAMPHETAMINE 30 MG PO TABS
30.0000 mg | ORAL_TABLET | Freq: Two times a day (BID) | ORAL | 0 refills | Status: DC | PRN
  Filled 2021-07-09 – 2021-08-06 (×2): qty 60, 30d supply, fill #0

## 2021-07-09 MED ORDER — BUSPIRONE HCL 10 MG PO TABS
10.0000 mg | ORAL_TABLET | Freq: Three times a day (TID) | ORAL | 2 refills | Status: DC
  Filled 2021-07-09: qty 90, 30d supply, fill #0

## 2021-08-06 ENCOUNTER — Other Ambulatory Visit (HOSPITAL_COMMUNITY): Payer: Self-pay

## 2021-10-22 ENCOUNTER — Other Ambulatory Visit (HOSPITAL_COMMUNITY): Payer: Self-pay

## 2021-10-22 MED ORDER — CARVEDILOL 3.125 MG PO TABS
3.1250 mg | ORAL_TABLET | Freq: Two times a day (BID) | ORAL | 0 refills | Status: DC
  Filled 2021-10-22: qty 60, 30d supply, fill #0

## 2021-11-04 ENCOUNTER — Other Ambulatory Visit (HOSPITAL_COMMUNITY): Payer: Self-pay
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
## 2021-11-21 ENCOUNTER — Ambulatory Visit: Payer: Medicare Other | Admitting: Cardiology
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
## 2021-11-27 ENCOUNTER — Ambulatory Visit: Payer: Medicare Other | Attending: Cardiology | Admitting: Cardiology

## 2021-11-27 ENCOUNTER — Encounter: Payer: Self-pay | Admitting: Cardiology

## 2021-11-27 VITALS — BP 102/70 | HR 75 | Ht 62.5 in | Wt 139.0 lb

## 2021-11-27 DIAGNOSIS — I2510 Atherosclerotic heart disease of native coronary artery without angina pectoris: Secondary | ICD-10-CM | POA: Diagnosis not present

## 2021-11-27 DIAGNOSIS — I110 Hypertensive heart disease with heart failure: Secondary | ICD-10-CM | POA: Diagnosis not present

## 2021-11-27 DIAGNOSIS — I340 Nonrheumatic mitral (valve) insufficiency: Secondary | ICD-10-CM | POA: Diagnosis not present

## 2021-11-27 DIAGNOSIS — J449 Chronic obstructive pulmonary disease, unspecified: Secondary | ICD-10-CM | POA: Diagnosis not present

## 2021-11-27 DIAGNOSIS — R0609 Other forms of dyspnea: Secondary | ICD-10-CM

## 2021-11-27 DIAGNOSIS — I3481 Nonrheumatic mitral (valve) annulus calcification: Secondary | ICD-10-CM

## 2021-11-27 DIAGNOSIS — R072 Precordial pain: Secondary | ICD-10-CM | POA: Diagnosis not present

## 2021-11-27 NOTE — Patient Instructions (Signed)
Medication Instructions:  Your physician recommends that you continue on your current medications as directed. Please refer to the Current Medication list given to you today.  *If you need a refill on your cardiac medications before your next appointment, please call your pharmacy*   Lab Work: Your physician recommends that you have a BMET and ProBNP.  If you have labs (blood work) drawn today and your tests are completely normal, you will receive your results only by: [place] (if you have MyChart) OR A paper copy in the mail If you have any lab test that is abnormal or we need to change your treatment, we will call you to review the results.   Testing/Procedures: Your physician has requested that you have cardiac CT. Cardiac computed tomography (CT) is a painless test that uses an x-ray machine to take clear, detailed pictures of your heart. For further information please visit [email or web address]. Please follow instruction sheet as given.    Your Cardiac CT will be scheduled at:   [facility] located off [facility] at the hospital.  Please arrive 30 minutes prior to your appointment time.  You can use the FREE valet parking offered at entrance to outpatient center (encouraged to control the heart rate for the test)   Please follow these instructions carefully (unless otherwise directed):  On the Night Before the Test: Be sure to Drink plenty of water. Do not consume any caffeinated/decaffeinated beverages or chocolate 12 hours prior to your test. Do not take any antihistamines 12 hours prior to your test.  On the Day of the Test: Drink plenty of water until 1 hour prior to the test. Do not eat any food 4 hours prior to the test. No smoking 4 hours prior to test. You may take your regular medications prior to the test.  Take Carvedilol (Coreg) two hours prior to test. Take 2 doses. FEMALES- please wear underwire-free bra if available, avoid  dresses & tight clothing. Wear plain shirt no beads, sparkles, rhinestones, metal or heavy embroidery.  After the Test: Drink plenty of water. After receiving IV contrast, you may experience a mild flushed feeling. This is normal. On occasion, you may experience a mild rash up to 24 hours after the test. This is not dangerous. If this occurs, you can take Benadryl 25 mg and increase your fluid intake. If you experience trouble breathing, this can be serious. If it is severe call 911 IMMEDIATELY. If it is mild, please call our office. If you take any of these medications: Glipizide/Metformin, Avandament, Glucavance, please do not take 48 hours after completing test unless otherwise instructed.  We will call to schedule your test 2-4 weeks out understanding that some insurance companies will need an authorization prior to the service being performed.      Follow-Up: At [facility], you and your health needs are our priority.  As part of our continuing mission to provide you with exceptional heart care, we have created designated Provider Care Teams.  These Care Teams include your primary Cardiologist (physician) and Advanced Practice Providers (APPs -  Physician Assistants and Nurse Practitioners) who all work together to provide you with the care you need, when you need it.  We recommend signing up for the patient portal called "MyChart".  Sign up information is provided on this After Visit Summary.  MyChart is used to connect with patients for Virtual Visits (Telemedicine).  Patients are able to view lab/test results, encounter notes, upcoming appointments, etc.  Non-urgent messages can be sent to your provider as well.   To learn more about what you can do with MyChart, go to [email or web address].    Your next appointment:   3 month(s)  The format for your next appointment:   In Person  Provider:   Norman Herrlich, MD  Other Instructions Cardiac CT Angiogram A cardiac CT  angiogram is a procedure to look at the heart and the area around the heart. It may be done to help find the cause of chest pains or other symptoms of heart disease. During this procedure, a substance called contrast dye is injected into the blood vessels in the area to be checked. A large X-ray machine, called a CT scanner, then takes detailed pictures of the heart and the surrounding area. The procedure is also sometimes called a coronary CT angiogram, coronary artery scanning, or CTA. A cardiac CT angiogram allows the health care provider to see how well blood is flowing to and from the heart. The health care provider will be able to see if there are any problems, such as: Blockage or narrowing of the coronary arteries in the heart. Fluid around the heart. Signs of weakness or disease in the muscles, valves, and tissues of the heart. Tell a health care provider about: Any allergies you have. This is especially important if you have had a previous allergic reaction to contrast dye. All medicines you are taking, including vitamins, herbs, eye drops, creams, and over-the-counter medicines. Any blood disorders you have. Any surgeries you have had. Any medical conditions you have. Whether you are pregnant or may be pregnant. Any anxiety disorders, chronic pain, or other conditions you have that may increase your stress or prevent you from lying still. What are the risks? Generally, this is a safe procedure. However, problems may occur, including: Bleeding. Infection. Allergic reactions to medicines or dyes. Damage to other structures or organs. Kidney damage from the contrast dye that is used. Increased risk of cancer from radiation exposure. This risk is low. Talk with your health care provider about: The risks and benefits of testing. How you can receive the lowest dose of radiation. What happens before the procedure? Wear comfortable clothing and remove any jewelry, glasses, dentures, and  hearing aids. Follow instructions from your health care provider about eating and drinking. This may include: For 12 hours before the procedure -- avoid caffeine. This includes tea, coffee, soda, energy drinks, and diet pills. Drink plenty of water or other fluids that do not have caffeine in them. Being well hydrated can prevent complications. For 4-6 hours before the procedure -- stop eating and drinking. The contrast dye can cause nausea, but this is less likely if your stomach is empty. Ask your health care provider about changing or stopping your regular medicines. This is especially important if you are taking diabetes medicines, blood thinners, or medicines to treat problems with erections (erectile dysfunction). What happens during the procedure?  Hair on your chest may need to be removed so that small sticky patches called electrodes can be placed on your chest. These will transmit information that helps to monitor your heart during the procedure. An IV will be inserted into one of your veins. You might be given a medicine to control your heart rate during the procedure. This will help to ensure that good images are obtained. You will be asked to lie on an exam table. This table will slide in and out of the CT machine during the procedure. Contrast  dye will be injected into the IV. You might feel warm, or you may get a metallic taste in your mouth. You will be given a medicine called nitroglycerin. This will relax or dilate the arteries in your heart. The table that you are lying on will move into the CT machine tunnel for the scan. The person running the machine will give you instructions while the scans are being done. You may be asked to: Keep your arms above your head. Hold your breath. Stay very still, even if the table is moving. When the scanning is complete, you will be moved out of the machine. The IV will be removed. The procedure may vary among health care providers and  hospitals. What can I expect after the procedure? After your procedure, it is common to have: A metallic taste in your mouth from the contrast dye. A feeling of warmth. A headache from the nitroglycerin. Follow these instructions at home: Take over-the-counter and prescription medicines only as told by your health care provider. If you are told, drink enough fluid to keep your urine pale yellow. This will help to flush the contrast dye out of your body. Most people can return to their normal activities right after the procedure. Ask your health care provider what activities are safe for you. It is up to you to get the results of your procedure. Ask your health care provider, or the department that is doing the procedure, when your results will be ready. Keep all follow-up visits as told by your health care provider. This is important. Contact a health care provider if: You have any symptoms of allergy to the contrast dye. These include: Shortness of breath. Rash or hives. A racing heartbeat. Summary A cardiac CT angiogram is a procedure to look at the heart and the area around the heart. It may be done to help find the cause of chest pains or other symptoms of heart disease. During this procedure, a large X-ray machine, called a CT scanner, takes detailed pictures of the heart and the surrounding area after a contrast dye has been injected into blood vessels in the area. Ask your health care provider about changing or stopping your regular medicines before the procedure. This is especially important if you are taking diabetes medicines, blood thinners, or medicines to treat erectile dysfunction. If you are told, drink enough fluid to keep your urine pale yellow. This will help to flush the contrast dye out of your body. This information is not intended to replace advice given to you by your health care provider. Make sure you discuss any questions you have with your health care  provider. Document Revised: 05/08/2021 Document Reviewed: 09/14/2018 Elsevier Patient Education  2023 Elsevier Inc.   Important Information About Sugar

## 2021-11-28 LAB — BASIC METABOLIC PANEL
BUN/Creatinine Ratio: 18 (ref 12–28)
BUN: 22 mg/dL (ref 8–27)
CO2: 23 mmol/L (ref 20–29)
Calcium: 10 mg/dL (ref 8.7–10.3)
Chloride: 102 mmol/L (ref 96–106)
Creatinine, Ser: 1.19 mg/dL — ABNORMAL HIGH (ref 0.57–1.00)
Glucose: 95 mg/dL (ref 70–99)
Potassium: 4.5 mmol/L (ref 3.5–5.2)
Sodium: 138 mmol/L (ref 134–144)
eGFR: 52 mL/min/{1.73_m2} — ABNORMAL LOW (ref >59)

## 2021-11-28 LAB — PRO B NATRIURETIC PEPTIDE: NT-Pro BNP: 250 pg/mL (ref 0–287)

## 2022-02-03 ENCOUNTER — Telehealth: Payer: Self-pay

## 2022-02-03 DIAGNOSIS — I2510 Atherosclerotic heart disease of native coronary artery without angina pectoris: Secondary | ICD-10-CM

## 2022-02-03 NOTE — Telephone Encounter (Signed)
Pt aware that she needs to come in for labs today or tomorrow for her CT scan. Pt verbalized understanding and had no additional questions.

## 2022-02-05 ENCOUNTER — Telehealth: Payer: Self-pay

## 2022-02-05 MED ORDER — IVABRADINE HCL 5 MG PO TABS: 10.0000 mg | ORAL_TABLET | Freq: Once | ORAL | 0 refills | Status: AC

## 2022-02-05 MED ORDER — METOPROLOL TARTRATE 100 MG PO TABS: ORAL_TABLET | ORAL | 0 refills | Status: DC

## 2022-02-05 NOTE — Telephone Encounter (Signed)
RH called stating CT was cancelled due to increased HR. She will be rescheduled. Metoprolol 100mg  and Ivabradine 10mg  2 hours prior to CT Scan.

## 2022-02-09 ENCOUNTER — Telehealth: Payer: Self-pay | Admitting: Cardiology

## 2022-02-09 ENCOUNTER — Other Ambulatory Visit: Payer: Self-pay

## 2022-02-09 MED ORDER — IVABRADINE HCL 5 MG PO TABS: 10.0000 mg | ORAL_TABLET | Freq: Once | ORAL | 0 refills | Status: AC

## 2022-02-09 MED ORDER — METOPROLOL TARTRATE 100 MG PO TABS: 100.0000 mg | ORAL_TABLET | Freq: Once | ORAL | 0 refills | Status: DC

## 2022-02-09 NOTE — Telephone Encounter (Signed)
Patient is calling to discuss her medication with  Dr. Bettina Gavia or nurse. Please call back

## 2022-02-09 NOTE — Telephone Encounter (Signed)
Called the patient and clarified the medications she would need to take to prep her for her cardiac CTA. Also confirmed that she needed to have the BMP lab drawn. Informed the patient that she would need to come in today to have her BMP drawn. Patient was in agreement with this plan and had no further questions at this time.

## 2022-02-10 LAB — BASIC METABOLIC PANEL
BUN/Creatinine Ratio: 21 (ref 12–28)
BUN: 22 mg/dL (ref 8–27)
CO2: 19 mmol/L — ABNORMAL LOW (ref 20–29)
Calcium: 9.9 mg/dL (ref 8.7–10.3)
Chloride: 103 mmol/L (ref 96–106)
Creatinine, Ser: 1.06 mg/dL — ABNORMAL HIGH (ref 0.57–1.00)
Glucose: 96 mg/dL (ref 70–99)
Potassium: 4.2 mmol/L (ref 3.5–5.2)
Sodium: 141 mmol/L (ref 134–144)
eGFR: 59 mL/min/{1.73_m2} — ABNORMAL LOW (ref >59)

## 2022-02-19 DIAGNOSIS — K5790 Diverticulosis of intestine, part unspecified, without perforation or abscess without bleeding: Secondary | ICD-10-CM | POA: Insufficient documentation

## 2022-02-19 DIAGNOSIS — Z803 Family history of malignant neoplasm of breast: Secondary | ICD-10-CM | POA: Insufficient documentation

## 2022-02-19 DIAGNOSIS — M79646 Pain in unspecified finger(s): Secondary | ICD-10-CM | POA: Insufficient documentation

## 2022-02-19 DIAGNOSIS — K5909 Other constipation: Secondary | ICD-10-CM | POA: Insufficient documentation

## 2022-02-19 DIAGNOSIS — M1990 Unspecified osteoarthritis, unspecified site: Secondary | ICD-10-CM | POA: Insufficient documentation

## 2022-02-19 DIAGNOSIS — R569 Unspecified convulsions: Secondary | ICD-10-CM | POA: Insufficient documentation

## 2022-02-19 DIAGNOSIS — Z79899 Other long term (current) drug therapy: Secondary | ICD-10-CM | POA: Insufficient documentation

## 2022-02-19 DIAGNOSIS — O039 Complete or unspecified spontaneous abortion without complication: Secondary | ICD-10-CM | POA: Insufficient documentation

## 2022-02-19 DIAGNOSIS — M25562 Pain in left knee: Secondary | ICD-10-CM | POA: Insufficient documentation
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
## 2022-03-02 ENCOUNTER — Ambulatory Visit: Payer: 59 | Attending: Cardiology | Admitting: Cardiology

## 2022-03-03 ENCOUNTER — Encounter: Payer: Self-pay | Admitting: Cardiology

## 2022-06-23 ENCOUNTER — Encounter (HOSPITAL_COMMUNITY): Payer: Self-pay

## 2022-06-23 ENCOUNTER — Emergency Department (HOSPITAL_COMMUNITY)
Admission: EM | Admit: 2022-06-23 | Discharge: 2022-06-23 | Disposition: A | Discharge status: Home / Self Care | Payer: 59 | Attending: Emergency Medicine | Admitting: Emergency Medicine

## 2022-06-23 ENCOUNTER — Other Ambulatory Visit: Payer: Self-pay

## 2022-06-23 DIAGNOSIS — I10 Essential (primary) hypertension: Secondary | ICD-10-CM | POA: Insufficient documentation

## 2022-06-23 DIAGNOSIS — R112 Nausea with vomiting, unspecified: Secondary | ICD-10-CM | POA: Diagnosis not present

## 2022-06-23 DIAGNOSIS — T428X1A Poisoning by antiparkinsonism drugs and other central muscle-tone depressants, accidental (unintentional), initial encounter: Secondary | ICD-10-CM | POA: Insufficient documentation

## 2022-06-23 LAB — COMPREHENSIVE METABOLIC PANEL
ALT: 40 U/L (ref 0–44)
AST: 32 U/L (ref 15–41)
Albumin: 4.3 g/dL (ref 3.5–5.0)
Alkaline Phosphatase: 118 U/L (ref 38–126)
Anion gap: 9 (ref 5–15)
BUN: 31 mg/dL — ABNORMAL HIGH (ref 8–23)
CO2: 23 mmol/L (ref 22–32)
Calcium: 10 mg/dL (ref 8.9–10.3)
Chloride: 106 mmol/L (ref 98–111)
Creatinine, Ser: 1.26 mg/dL — ABNORMAL HIGH (ref 0.44–1.00)
GFR, Estimated: 48 mL/min — ABNORMAL LOW (ref >60)
Glucose, Bld: 140 mg/dL — ABNORMAL HIGH (ref 70–99)
Potassium: 4 mmol/L (ref 3.5–5.1)
Sodium: 138 mmol/L (ref 135–145)
Total Bilirubin: 0.9 mg/dL (ref 0.3–1.2)
Total Protein: 7.8 g/dL (ref 6.5–8.1)

## 2022-06-23 LAB — CBC
HCT: 35.2 % — ABNORMAL LOW (ref 36.0–46.0)
Hemoglobin: 11.5 g/dL — ABNORMAL LOW (ref 12.0–15.0)
MCH: 31 pg (ref 26.0–34.0)
MCHC: 32.7 g/dL (ref 30.0–36.0)
MCV: 94.9 fL (ref 80.0–100.0)
Platelets: 216 10*3/uL (ref 150–400)
RBC: 3.71 MIL/uL — ABNORMAL LOW (ref 3.87–5.11)
RDW: 13.6 % (ref 11.5–15.5)
WBC: 5.8 10*3/uL (ref 4.0–10.5)
nRBC: 0 % (ref 0.0–0.2)

## 2022-06-23 LAB — ACETAMINOPHEN LEVEL: Acetaminophen (Tylenol), Serum: <10 ug/mL — ABNORMAL LOW (ref 10–30)

## 2022-06-23 LAB — LIPASE, BLOOD: Lipase: 48 U/L (ref 11–51)

## 2022-06-23 LAB — SALICYLATE LEVEL: Salicylate Lvl: <7 mg/dL — ABNORMAL LOW (ref 7.0–30.0)

## 2022-06-23 MED ORDER — SODIUM CHLORIDE 0.9 % IV BOLUS
1000.0000 mL | Freq: Once | INTRAVENOUS | Status: AC
  Administered 2022-06-23: 1000 mL via INTRAVENOUS

## 2022-06-23 NOTE — ED Triage Notes (Signed)
PT arrives POV ambulatory with a c/o of being disoriented and abdominal pain. PT took her husbands baclofen 20mg (3) last night to get some sleep.Pt vomited a yellow liquid up about 30 min ago and has no appetite.

## 2022-06-23 NOTE — Discharge Instructions (Signed)
You were seen in the emergency department for your confusion, nausea and vomiting after taking the baclofen.  This is likely side effects from taking too much baclofen.  You should not take any medications not prescribed to you due to potential side effects and you can follow-up with your primary doctor to have your symptoms rechecked and to further discuss alternative sleep aids if you are continuing to have insomnia.  You should return to the emergency department if you have continued repetitive vomiting, you have severe abdominal pain, you have a seizure or if you have any other new or concerning symptoms.
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
## 2022-07-25 NOTE — Progress Notes (Signed)
Patient presented to the emergency department after an accidental overdose.  Tylenol and salicylate levels were ordered to evaluate for possible coingestions in the setting of overdose.

## 2022-10-12 ENCOUNTER — Encounter: Payer: Self-pay | Admitting: *Deleted

## 2022-10-12 ENCOUNTER — Encounter: Payer: Self-pay | Admitting: Cardiology

## 2022-10-12 DIAGNOSIS — I10 Essential (primary) hypertension: Secondary | ICD-10-CM

## 2022-10-12 DIAGNOSIS — K219 Gastro-esophageal reflux disease without esophagitis: Secondary | ICD-10-CM | POA: Insufficient documentation

## 2022-10-12 DIAGNOSIS — J449 Chronic obstructive pulmonary disease, unspecified: Secondary | ICD-10-CM | POA: Insufficient documentation

## 2022-10-12 DIAGNOSIS — E785 Hyperlipidemia, unspecified: Secondary | ICD-10-CM

## 2022-10-12 DIAGNOSIS — N1831 Chronic kidney disease, stage 3a: Secondary | ICD-10-CM

## 2022-10-12 DIAGNOSIS — J439 Emphysema, unspecified: Secondary | ICD-10-CM

## 2022-10-12 DIAGNOSIS — K2100 Gastro-esophageal reflux disease with esophagitis, without bleeding: Secondary | ICD-10-CM

## 2022-10-18 ENCOUNTER — Encounter: Payer: Self-pay | Admitting: Cardiology

## 2022-10-18 NOTE — Progress Notes (Deleted)
Cardiology Office Note:  .   Date:  10/18/2022  ID:  Cristina Myers, DOB Aug 01, 1960, MRN [id number] PCP: Alveria Apley, NP (Inactive)  [facility] Providers Cardiologist:  None { Click to update primary MD,subspecialty MD or APP then REFRESH:1}   History of Present Illness: [name] Kitchen   Cristina Myers is a 62 y.o. 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  11/25/2017 CT of the chest for lung cancer screening advanced coronary artery atherosclerosis noted 04/08/2020 echo EF 55 to 60%, mild concentric LVH, mitral valve leaflets mildly thickened with mild mitral annular calcification, moderate MR.  She establish care with Dr. Dulce Sellar in October 2023 at the behest of her PCP for evaluation of chest pain.  It appears a coronary CTA was ordered but does not appear this was completed.  ROS: ROS   Studies Reviewed: .        Cardiac Studies & Procedures       ECHOCARDIOGRAM  ECHOCARDIOGRAM COMPLETE 09/18/2014  Narrative *[place]* *[facility]* [address] [place], [place] [address] [phone]  ------------------------------------------------------------------- Transthoracic Echocardiography  Patient:    Cristina, Myers MR #:       [id number] Study Date: 09/18/2014 Gender:     F Age:        54 Height:     157.5 cm Weight:     53.1 kg BSA:        1.53 m^2 Pt. Status: Room:       5W26C  SONOGRAPHER  Cathie Beams ADMITTING    Marcellus Scott D ORDERING     [place], [place] D REFERRING    [place], [place] D ATTENDING    Marlin Canary U PERFORMING   [facility], Inpatient  cc:  ------------------------------------------------------------------- LV EF: 55% -    60%  ------------------------------------------------------------------- Indications:      TIA 435.9.  ------------------------------------------------------------------- History:   PMH:  Anxiety. Attention deficit hyperactivity disorder. Backache. Chronic prescription benzodiazepine use.  Risk factors: Current tobacco use. Hypertension. Dyslipidemia.  ------------------------------------------------------------------- Study Conclusions  - Left ventricle: The cavity size was normal. Wall thickness was increased in a pattern of mild LVH. Systolic function was normal. The estimated ejection fraction was in the range of 55% to 60%. Wall motion was normal; there were no regional wall motion abnormalities. Doppler parameters are consistent with abnormal left ventricular relaxation (grade 1 diastolic dysfunction). - Aortic valve: There was no stenosis. - Mitral valve: There was trivial regurgitation. - Left atrium: The atrium was mildly dilated. - Right ventricle: The cavity size was normal. Systolic function was normal. - Pulmonary arteries: No complete TR doppler jet so unable to estimate PA systolic pressure. - Systemic veins: IVC measured 2.1 cm with > 50% respirophasic variation, suggesting RA pressure 8 mmHg.  Impressions:  - Normal LV size with mild LV hypertrophy, EF 55-60%. Normal RV size and systolic function. Mild LAE. No significant valvular abnormalities.  Transthoracic echocardiography.  M-mode, complete 2D, spectral Doppler, and color Doppler.  Birthdate:  Patient birthdate: [birth year].  Age:  Patient is 62 yr old.  Sex:  Gender: female. BMI: 21.4 kg/m^2.  Blood pressure:     153/97  Patient status: Inpatient.  Study date:  Study date: 09/18/2014. Study time: 02:54 PM.  Location:  Echo laboratory.  -------------------------------------------------------------------  ------------------------------------------------------------------- Left ventricle:  The cavity  size was normal. Wall thickness was increased in a pattern of mild LVH. Systolic function was normal. The estimated ejection fraction was in the range of 55% to 60%. Wall motion was normal; there were no regional wall motion abnormalities. Doppler parameters are consistent with abnormal left ventricular relaxation (grade 1 diastolic dysfunction).  ------------------------------------------------------------------- Aortic valve:   Trileaflet; mildly calcified leaflets.  Doppler: There was no stenosis.   There was no regurgitation.  ------------------------------------------------------------------- Aorta:  Aortic root: The aortic root was normal in size. Ascending aorta: The ascending aorta was normal in size.  ------------------------------------------------------------------- Mitral valve:   Normal thickness leaflets .  Doppler:   There was no evidence for stenosis.   There was trivial regurgitation. Peak gradient (D): 2 mm Hg.  ------------------------------------------------------------------- Left atrium:  The atrium was mildly dilated.  ------------------------------------------------------------------- Right ventricle:  The cavity size was normal. Systolic function was normal.  ------------------------------------------------------------------- Pulmonic valve:    Structurally normal valve.   Cusp separation was normal.  Doppler:  Transvalvular velocity was within the normal range. There was trivial regurgitation.  ------------------------------------------------------------------- Tricuspid valve:   Doppler:  There was trivial regurgitation.  ------------------------------------------------------------------- Pulmonary artery:   No complete TR doppler jet so unable to estimate PA systolic pressure.  ------------------------------------------------------------------- Right atrium:  The atrium was normal in  size.  ------------------------------------------------------------------- Pericardium:  There was no pericardial effusion.  ------------------------------------------------------------------- Systemic veins:  IVC measured 2.1 cm with > 50% respirophasic variation, suggesting RA pressure 8 mmHg.  ------------------------------------------------------------------- Measurements  Left ventricle                         Value        Reference LV ID, ED, PLAX chordal        (L)     33.3  mm     43 - 52 LV ID, ES, PLAX chordal                23.3  mm     23 - 38 LV fx shortening, PLAX chordal         30    %      >=29 LV PW thickness, ED                    10.4  mm     --------- IVS/LV PW ratio, ED                    1.15         <=1.3 LV e&', lateral                         11.1  cm/s   --------- LV E/e&', lateral                       6.38         --------- LV e&', medial                          6.74  cm/s   --------- LV E/e&', medial                        10.5         --------- LV e&', average  8.92  cm/s   --------- LV E/e&', average                       7.94         ---------  Ventricular septum                     Value        Reference IVS thickness, ED                      12    mm     ---------  LVOT                                   Value        Reference LVOT ID, S                             20    mm     --------- LVOT area                              3.14  cm^2   ---------  Aorta                                  Value        Reference Aortic root ID, ED                     30    mm     ---------  Left atrium                            Value        Reference LA ID, A-P, ES                         30    mm     --------- LA ID/bsa, A-P                         1.97  cm/m^2 <=2.2 LA volume, S                           47.5  ml     --------- LA volume/bsa, S                       31.1  ml/m^2 --------- LA volume, ES, 1-p A4C                 39.7  ml      --------- LA volume/bsa, ES, 1-p A4C             26    ml/m^2 --------- LA volume, ES, 1-p A2C                 55.1  ml     --------- LA volume/bsa, ES, 1-p A2C             36.1  ml/m^2 ---------  Mitral valve  Value        Reference Mitral E-wave peak velocity            70.8  cm/s   --------- Mitral A-wave peak velocity            59.2  cm/s   --------- Mitral deceleration time       (H)     299   ms     150 - 230 Mitral peak gradient, D                2     mm Hg  --------- Mitral E/A ratio, peak                 1.2          ---------  Right ventricle                        Value        Reference RV s&', lateral, S                      12.4  cm/s   ---------  Pulmonic valve                         Value        Reference Pulmonic regurg velocity, ED           96.4  cm/s   ---------  Legend: (L)  and  (H)  mark values outside specified reference range.  ------------------------------------------------------------------- Prepared and Electronically Authenticated by  Marca Ancona, M.D. 2016-08-16T17:48:28             Risk Assessment/Calculations:   {Does this patient have ATRIAL FIBRILLATION?[phone]} No BP recorded.  {Refresh Note OR Click here to enter BP  :1}***       Physical Exam:   VS:  There were no vitals taken for this visit.   Wt Readings from Last 3 Encounters:  10/08/22 135 lb (61.2 kg)  06/23/22 130 lb (59 kg)  11/27/21 139 lb (63 kg)    GEN: Well nourished, well developed in no acute distress NECK: No JVD; No carotid bruits CARDIAC: ***RRR, no murmurs, rubs, gallops RESPIRATORY:  Clear to auscultation without rales, wheezing or rhonchi  ABDOMEN: Soft, non-tender, non-distended EXTREMITIES:  No edema; No deformity   ASSESSMENT AND PLAN: .   ***    {Are you ordering a CV Procedure (e.g. stress test, cath, DCCV, TEE, etc)?   Press F2        :[id number]}  Dispo: ***  Signed, Flossie Dibble, NP

## 2022-10-19 ENCOUNTER — Ambulatory Visit: Payer: Medicare Other | Attending: Cardiology | Admitting: Cardiology

## 2022-10-19 DIAGNOSIS — I3481 Nonrheumatic mitral (valve) annulus calcification: Secondary | ICD-10-CM

## 2022-10-19 DIAGNOSIS — I10 Essential (primary) hypertension: Secondary | ICD-10-CM

## 2022-10-19 DIAGNOSIS — E785 Hyperlipidemia, unspecified: Secondary | ICD-10-CM

## 2022-10-19 DIAGNOSIS — J449 Chronic obstructive pulmonary disease, unspecified: Secondary | ICD-10-CM

## 2022-10-19 DIAGNOSIS — I110 Hypertensive heart disease with heart failure: Secondary | ICD-10-CM

## 2022-10-19 DIAGNOSIS — I2510 Atherosclerotic heart disease of native coronary artery without angina pectoris: Secondary | ICD-10-CM

## 2022-11-02 NOTE — Progress Notes (Signed)
Cardiology Office Note:  .   Date:  11/02/2022  ID:  Cristina Myers, DOB 1960/03/26, MRN [id number] PCP: Alveria Apley, NP (Inactive)  [facility] Providers Cardiologist:  None { Click to update primary MD,subspecialty MD or APP then REFRESH:1}   History of Present Illness: [name] Kitchen   Cristina Myers is a 62 y.o. 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  11/25/2017 CT of the chest for lung cancer screening advanced coronary artery atherosclerosis noted 04/08/2020 echo EF 55 to 60%, mild concentric LVH, mitral valve leaflets mildly thickened with mild mitral annular calcification, moderate MR.  She established care with Dr. Dulce Sellar in October 2023 at the behest of her PCP for evaluation of chest pain.  It appears a coronary CTA was ordered but does not appear this was completed.  ROS: ROS   Studies Reviewed: .        Cardiac Studies & Procedures       ECHOCARDIOGRAM  ECHOCARDIOGRAM COMPLETE 09/18/2014  Narrative *[place]* *[facility]* [address] [place], [place] [address] [phone]  ------------------------------------------------------------------- Transthoracic Echocardiography  Patient:    Cristina, Myers MR #:       [id number] Study Date: 09/18/2014 Gender:     F Age:        54 Height:     157.5 cm Weight:     53.1 kg BSA:        1.53 m^2 Pt. Status: Room:       5W26C  SONOGRAPHER  Cathie Beams ADMITTING    Marcellus Scott D ORDERING     [place], [place] D REFERRING    [place], [place] D ATTENDING    Marlin Canary U PERFORMING   [facility], Inpatient  cc:  ------------------------------------------------------------------- LV EF: 55% -    60%  ------------------------------------------------------------------- Indications:      TIA 435.9.  ------------------------------------------------------------------- History:   PMH:  Anxiety. Attention deficit hyperactivity disorder. Backache. Chronic prescription benzodiazepine use.  Risk factors: Current tobacco use. Hypertension. Dyslipidemia.  ------------------------------------------------------------------- Study Conclusions  - Left ventricle: The cavity size was normal. Wall thickness was increased in a pattern of mild LVH. Systolic function was normal. The estimated ejection fraction was in the range of 55% to 60%. Wall motion was normal; there were no regional wall motion abnormalities. Doppler parameters are consistent with abnormal left ventricular relaxation (grade 1 diastolic dysfunction). - Aortic valve: There was no stenosis. - Mitral valve: There was trivial regurgitation. - Left atrium: The atrium was mildly dilated. - Right ventricle: The cavity size was normal. Systolic function was normal. - Pulmonary arteries: No complete TR doppler jet so unable to estimate PA systolic pressure. - Systemic veins: IVC measured 2.1 cm with > 50% respirophasic variation, suggesting RA pressure 8 mmHg.  Impressions:  - Normal LV size with mild LV hypertrophy, EF 55-60%. Normal RV size and systolic function. Mild LAE. No significant valvular abnormalities.  Transthoracic echocardiography.  M-mode, complete 2D, spectral Doppler, and color Doppler.  Birthdate:  Patient birthdate: 01-18-1961.  Age:  Patient is 62 yr old.  Sex:  Gender: female. BMI: 21.4 kg/m^2.  Blood pressure:     153/97  Patient status: Inpatient.  Study date:  Study date: 09/18/2014. Study time: 02:54 PM.  Location:  Echo laboratory.  -------------------------------------------------------------------  ------------------------------------------------------------------- Left ventricle:  The cavity  size was normal. Wall thickness was increased in a pattern of mild LVH. Systolic function was normal. The estimated ejection fraction was in the range of 55% to 60%. Wall motion was normal; there were no regional wall motion abnormalities. Doppler parameters are consistent with abnormal left ventricular relaxation (grade 1 diastolic dysfunction).  ------------------------------------------------------------------- Aortic valve:   Trileaflet; mildly calcified leaflets.  Doppler: There was no stenosis.   There was no regurgitation.  ------------------------------------------------------------------- Aorta:  Aortic root: The aortic root was normal in size. Ascending aorta: The ascending aorta was normal in size.  ------------------------------------------------------------------- Mitral valve:   Normal thickness leaflets .  Doppler:   There was no evidence for stenosis.   There was trivial regurgitation. Peak gradient (D): 2 mm Hg.  ------------------------------------------------------------------- Left atrium:  The atrium was mildly dilated.  ------------------------------------------------------------------- Right ventricle:  The cavity size was normal. Systolic function was normal.  ------------------------------------------------------------------- Pulmonic valve:    Structurally normal valve.   Cusp separation was normal.  Doppler:  Transvalvular velocity was within the normal range. There was trivial regurgitation.  ------------------------------------------------------------------- Tricuspid valve:   Doppler:  There was trivial regurgitation.  ------------------------------------------------------------------- Pulmonary artery:   No complete TR doppler jet so unable to estimate PA systolic pressure.  ------------------------------------------------------------------- Right atrium:  The atrium was normal in  size.  ------------------------------------------------------------------- Pericardium:  There was no pericardial effusion.  ------------------------------------------------------------------- Systemic veins:  IVC measured 2.1 cm with > 50% respirophasic variation, suggesting RA pressure 8 mmHg.  ------------------------------------------------------------------- Measurements  Left ventricle                         Value        Reference LV ID, ED, PLAX chordal        (L)     33.3  mm     43 - 52 LV ID, ES, PLAX chordal                23.3  mm     23 - 38 LV fx shortening, PLAX chordal         30    %      >=29 LV PW thickness, ED                    10.4  mm     --------- IVS/LV PW ratio, ED                    1.15         <=1.3 LV e&', lateral                         11.1  cm/s   --------- LV E/e&', lateral                       6.38         --------- LV e&', medial                          6.74  cm/s   --------- LV E/e&', medial                        10.5         --------- LV e&', average  8.92  cm/s   --------- LV E/e&', average                       7.94         ---------  Ventricular septum                     Value        Reference IVS thickness, ED                      12    mm     ---------  LVOT                                   Value        Reference LVOT ID, S                             20    mm     --------- LVOT area                              3.14  cm^2   ---------  Aorta                                  Value        Reference Aortic root ID, ED                     30    mm     ---------  Left atrium                            Value        Reference LA ID, A-P, ES                         30    mm     --------- LA ID/bsa, A-P                         1.97  cm/m^2 <=2.2 LA volume, S                           47.5  ml     --------- LA volume/bsa, S                       31.1  ml/m^2 --------- LA volume, ES, 1-p A4C                 39.7  ml      --------- LA volume/bsa, ES, 1-p A4C             26    ml/m^2 --------- LA volume, ES, 1-p A2C                 55.1  ml     --------- LA volume/bsa, ES, 1-p A2C             36.1  ml/m^2 ---------  Mitral valve  Value        Reference Mitral E-wave peak velocity            70.8  cm/s   --------- Mitral A-wave peak velocity            59.2  cm/s   --------- Mitral deceleration time       (H)     299   ms     150 - 230 Mitral peak gradient, D                2     mm Hg  --------- Mitral E/A ratio, peak                 1.2          ---------  Right ventricle                        Value        Reference RV s&', lateral, S                      12.4  cm/s   ---------  Pulmonic valve                         Value        Reference Pulmonic regurg velocity, ED           96.4  cm/s   ---------  Legend: (L)  and  (H)  mark values outside specified reference range.  ------------------------------------------------------------------- Prepared and Electronically Authenticated by  Marca Ancona, M.D. 2016-08-16T17:48:28             Risk Assessment/Calculations:   {Does this patient have ATRIAL FIBRILLATION?:[phone]} No BP recorded.  {Refresh Note OR Click here to enter BP  :1}***       Physical Exam:   VS:  There were no vitals taken for this visit.   Wt Readings from Last 3 Encounters:  10/08/22 135 lb (61.2 kg)  06/23/22 130 lb (59 kg)  11/27/21 139 lb (63 kg)    GEN: Well nourished, well developed in no acute distress NECK: No JVD; No carotid bruits CARDIAC: ***RRR, no murmurs, rubs, gallops RESPIRATORY:  Clear to auscultation without rales, wheezing or rhonchi  ABDOMEN: Soft, non-tender, non-distended EXTREMITIES:  No edema; No deformity   ASSESSMENT AND PLAN: .   ***    {Are you ordering a CV Procedure (e.g. stress test, cath, DCCV, TEE, etc)?   Press F2        :[id number]}  Dispo: ***  Signed, Flossie Dibble, NP

## 2022-11-03 ENCOUNTER — Ambulatory Visit: Payer: Medicare Other | Attending: Cardiology | Admitting: Cardiology

## 2022-11-03 ENCOUNTER — Encounter: Payer: Self-pay | Admitting: Cardiology

## 2022-11-03 VITALS — BP 92/72 | HR 94 | Ht 62.0 in | Wt 132.0 lb

## 2022-11-03 DIAGNOSIS — R072 Precordial pain: Secondary | ICD-10-CM | POA: Diagnosis not present

## 2022-11-03 DIAGNOSIS — I340 Nonrheumatic mitral (valve) insufficiency: Secondary | ICD-10-CM

## 2022-11-03 DIAGNOSIS — R0609 Other forms of dyspnea: Secondary | ICD-10-CM | POA: Diagnosis not present

## 2022-11-03 DIAGNOSIS — Z13220 Encounter for screening for lipoid disorders: Secondary | ICD-10-CM

## 2022-11-03 DIAGNOSIS — J449 Chronic obstructive pulmonary disease, unspecified: Secondary | ICD-10-CM

## 2022-11-03 DIAGNOSIS — I2510 Atherosclerotic heart disease of native coronary artery without angina pectoris: Secondary | ICD-10-CM | POA: Diagnosis not present

## 2022-11-03 DIAGNOSIS — I110 Hypertensive heart disease with heart failure: Secondary | ICD-10-CM

## 2022-11-03 DIAGNOSIS — Z79899 Other long term (current) drug therapy: Secondary | ICD-10-CM

## 2022-11-03 MED ORDER — METOPROLOL TARTRATE 100 MG PO TABS: ORAL_TABLET | ORAL | 0 refills | Status: DC

## 2022-11-03 NOTE — Patient Instructions (Signed)
Medication Instructions:   TAKE: Metoprolol 100mg  1 tablet 2 hours prior to CT Scan  *If you need a refill on your cardiac medications before your next appointment, please call your pharmacy*   Lab Work: CMP, CBC, ProBNP, Direct LDL- today If you have labs (blood work) drawn today and your tests are completely normal, you will receive your results only by: MyChart Message (if you have MyChart) OR A paper copy in the mail If you have any lab test that is abnormal or we need to change your treatment, we will call you to review the results.   Testing/Procedures:  Your physician has requested that you have an echocardiogram. Echocardiography is a painless test that uses sound waves to create images of your heart. It provides your doctor with information about the size and shape of your heart and how well your heart's chambers and valves are working. This procedure takes approximately one hour. There are no restrictions for this procedure. Please do NOT wear cologne, perfume, aftershave, or lotions (deodorant is allowed).   Please arrive 15 minutes prior to your appointment time.  Your physician has requested that you have cardiac CT. Cardiac computed tomography (CT) is a painless test that uses an x-ray machine to take clear, detailed pictures of your heart. For further information please visit [email or web address] Please follow instruction sheet as given.    Your Cardiac CT will be scheduled at:   [facility] located off [facility] at the hospital.  Please arrive 30 minutes prior to your appointment time.  You can use the FREE valet parking offered at entrance to outpatient center (encouraged to control the heart rate for the test)   Please follow these instructions carefully (unless otherwise directed):  Hold all erectile dysfunction medications at least 3 days (72 hrs) prior to test.  On the Night Before the Test: Be sure to Drink plenty of water. Do not  consume any caffeinated/decaffeinated beverages or chocolate 12 hours prior to your test. Do not take any antihistamines 12 hours prior to your test. If the patient has contrast allergy: Patient will need a prescription for Prednisone and very clear instructions (as follows): Prednisone 50 mg - take 13 hours prior to test Take another Prednisone 50 mg 7 hours prior to test Take another Prednisone 50 mg 1 hour prior to test Take Benadryl 50 mg 1 hour prior to test Patient must complete all four doses of above prophylactic medications. Patient will need a ride after test due to Benadryl.  On the Day of the Test: Drink plenty of water until 1 hour prior to the test. Do not eat any food 4 hours prior to the test. No smoking 4 hours prior to test. You may take your regular medications prior to the test.  Take metoprolol (Lopressor) two hours prior to test. HOLD Furosemide/Hydrochlorothiazide morning of the test. FEMALES- please wear underwire-free bra if available, avoid dresses & tight clothing. Wear plain shirt no beads, sparkles, rhinestones, metal or heavy embroidery.  After the Test: Drink plenty of water. After receiving IV contrast, you may experience a mild flushed feeling. This is normal. On occasion, you may experience a mild rash up to 24 hours after the test. This is not dangerous. If this occurs, you can take Benadryl 25 mg and increase your fluid intake. If you experience trouble breathing, this can be serious. If it is severe call 911 IMMEDIATELY. If it is mild, please call our office. If you take any of these  medications: Glipizide/Metformin, Avandament, Glucavance, please do not take 48 hours after completing test unless otherwise instructed.  We will call to schedule your test 2-4 weeks out understanding that some insurance companies will need an authorization prior to the service being performed.      Follow-Up: At [facility], you and your health needs are  our priority.  As part of our continuing mission to provide you with exceptional heart care, we have created designated Provider Care Teams.  These Care Teams include your primary Cardiologist (physician) and Advanced Practice Providers (APPs -  Physician Assistants and Nurse Practitioners) who all work together to provide you with the care you need, when you need it.  We recommend signing up for the patient portal called "MyChart".  Sign up information is provided on this After Visit Summary.  MyChart is used to connect with patients for Virtual Visits (Telemedicine).  Patients are able to view lab/test results, encounter notes, upcoming appointments, etc.  Non-urgent messages can be sent to your provider as well.   To learn more about what you can do with MyChart, go to [email or web address].    Your next appointment:   6 week(s)  The format for your next appointment:   In Person  Provider:   Wallis Bamberg, NP Rosalita Levan)    Other Instructions Cardiac CT Angiogram A cardiac CT angiogram is a procedure to look at the heart and the area around the heart. It may be done to help find the cause of chest pains or other symptoms of heart disease. During this procedure, a substance called contrast dye is injected into the blood vessels in the area to be checked. A large X-ray machine, called a CT scanner, then takes detailed pictures of the heart and the surrounding area. The procedure is also sometimes called a coronary CT angiogram, coronary artery scanning, or CTA. A cardiac CT angiogram allows the health care provider to see how well blood is flowing to and from the heart. The health care provider will be able to see if there are any problems, such as: Blockage or narrowing of the coronary arteries in the heart. Fluid around the heart. Signs of weakness or disease in the muscles, valves, and tissues of the heart. Tell a health care provider about: Any allergies you have. This is especially  important if you have had a previous allergic reaction to contrast dye. All medicines you are taking, including vitamins, herbs, eye drops, creams, and over-the-counter medicines. Any blood disorders you have. Any surgeries you have had. Any medical conditions you have. Whether you are pregnant or may be pregnant. Any anxiety disorders, chronic pain, or other conditions you have that may increase your stress or prevent you from lying still. What are the risks? Generally, this is a safe procedure. However, problems may occur, including: Bleeding. Infection. Allergic reactions to medicines or dyes. Damage to other structures or organs. Kidney damage from the contrast dye that is used. Increased risk of cancer from radiation exposure. This risk is low. Talk with your health care provider about: The risks and benefits of testing. How you can receive the lowest dose of radiation. What happens before the procedure? Wear comfortable clothing and remove any jewelry, glasses, dentures, and hearing aids. Follow instructions from your health care provider about eating and drinking. This may include: For 12 hours before the procedure -- avoid caffeine. This includes tea, coffee, soda, energy drinks, and diet pills. Drink plenty of water or other fluids that do not have  caffeine in them. Being well hydrated can prevent complications. For 4-6 hours before the procedure -- stop eating and drinking. The contrast dye can cause nausea, but this is less likely if your stomach is empty. Ask your health care provider about changing or stopping your regular medicines. This is especially important if you are taking diabetes medicines, blood thinners, or medicines to treat problems with erections (erectile dysfunction). What happens during the procedure?  Hair on your chest may need to be removed so that small sticky patches called electrodes can be placed on your chest. These will transmit information that helps  to monitor your heart during the procedure. An IV will be inserted into one of your veins. You might be given a medicine to control your heart rate during the procedure. This will help to ensure that good images are obtained. You will be asked to lie on an exam table. This table will slide in and out of the CT machine during the procedure. Contrast dye will be injected into the IV. You might feel warm, or you may get a metallic taste in your mouth. You will be given a medicine called nitroglycerin. This will relax or dilate the arteries in your heart. The table that you are lying on will move into the CT machine tunnel for the scan. The person running the machine will give you instructions while the scans are being done. You may be asked to: Keep your arms above your head. Hold your breath. Stay very still, even if the table is moving. When the scanning is complete, you will be moved out of the machine. The IV will be removed. The procedure may vary among health care providers and hospitals. What can I expect after the procedure? After your procedure, it is common to have: A metallic taste in your mouth from the contrast dye. A feeling of warmth. A headache from the nitroglycerin. Follow these instructions at home: Take over-the-counter and prescription medicines only as told by your health care provider. If you are told, drink enough fluid to keep your urine pale yellow. This will help to flush the contrast dye out of your body. Most people can return to their normal activities right after the procedure. Ask your health care provider what activities are safe for you. It is up to you to get the results of your procedure. Ask your health care provider, or the department that is doing the procedure, when your results will be ready. Keep all follow-up visits as told by your health care provider. This is important. Contact a health care provider if: You have any symptoms of allergy to the  contrast dye. These include: Shortness of breath. Rash or hives. A racing heartbeat. Summary A cardiac CT angiogram is a procedure to look at the heart and the area around the heart. It may be done to help find the cause of chest pains or other symptoms of heart disease. During this procedure, a large X-ray machine, called a CT scanner, takes detailed pictures of the heart and the surrounding area after a contrast dye has been injected into blood vessels in the area. Ask your health care provider about changing or stopping your regular medicines before the procedure. This is especially important if you are taking diabetes medicines, blood thinners, or medicines to treat erectile dysfunction. If you are told, drink enough fluid to keep your urine pale yellow. This will help to flush the contrast dye out of your body. This information is not intended to  replace advice given to you by your health care provider. Make sure you discuss any questions you have with your health care provider. Document Revised: 05/08/2021 Document Reviewed: 09/14/2018 Elsevier Patient Education  2023 [employer].   Important Information About Sugar

## 2022-11-04 LAB — COMPREHENSIVE METABOLIC PANEL WITH GFR
ALT: 25 IU/L (ref 0–32)
AST: 35 IU/L (ref 0–40)
Albumin: 4.5 g/dL (ref 3.9–4.9)
Alkaline Phosphatase: 123 IU/L — ABNORMAL HIGH (ref 44–121)
BUN/Creatinine Ratio: 31 — ABNORMAL HIGH (ref 12–28)
BUN: 39 mg/dL — ABNORMAL HIGH (ref 8–27)
Bilirubin Total: 0.3 mg/dL (ref 0.0–1.2)
CO2: 22 mmol/L (ref 20–29)
Calcium: 9.6 mg/dL (ref 8.7–10.3)
Chloride: 104 mmol/L (ref 96–106)
Creatinine, Ser: 1.26 mg/dL — ABNORMAL HIGH (ref 0.57–1.00)
Globulin, Total: 2.4 g/dL (ref 1.5–4.5)
Glucose: 95 mg/dL (ref 70–99)
Potassium: 4 mmol/L (ref 3.5–5.2)
Sodium: 141 mmol/L (ref 134–144)
Total Protein: 6.9 g/dL (ref 6.0–8.5)
eGFR: 48 mL/min/1.73 — ABNORMAL LOW

## 2022-11-04 LAB — LDL CHOLESTEROL, DIRECT: LDL Direct: 140 mg/dL — ABNORMAL HIGH (ref 0–99)

## 2022-11-04 LAB — CBC
Hematocrit: 34 % (ref 34.0–46.6)
Hemoglobin: 11.3 g/dL (ref 11.1–15.9)
MCH: 32.7 pg (ref 26.6–33.0)
MCHC: 33.2 g/dL (ref 31.5–35.7)
MCV: 98 fL — ABNORMAL HIGH (ref 79–97)
Platelets: 185 x10E3/uL (ref 150–450)
RBC: 3.46 x10E6/uL — ABNORMAL LOW (ref 3.77–5.28)
RDW: 12.5 % (ref 11.7–15.4)
WBC: 3.7 x10E3/uL (ref 3.4–10.8)

## 2022-11-04 LAB — PRO B NATRIURETIC PEPTIDE: NT-Pro BNP: 724 pg/mL — ABNORMAL HIGH (ref 0–287)

## 2022-11-05 ENCOUNTER — Telehealth: Payer: Self-pay

## 2022-11-05 DIAGNOSIS — I110 Hypertensive heart disease with heart failure: Secondary | ICD-10-CM

## 2022-11-05 MED ORDER — FUROSEMIDE 20 MG PO TABS: 20.0000 mg | ORAL_TABLET | Freq: Every day | ORAL | 3 refills | Status: AC

## 2022-11-05 NOTE — Telephone Encounter (Signed)
Viewed in MyChart Routed to PCP  

## 2022-11-05 NOTE — Telephone Encounter (Signed)
-----   Message from Flossie Dibble sent at 11/04/2022 11:22 AM EDT ----- Stable kidney dysfunction.  Electrolytes ok.   No anemia or infection.  Cholesterol too high, can we see if she is still taking Crestor? It is not on her med list but Dr. Dulce Sellar reported she was still on it in October.   Lets give her lasix 20 mg daily, repeat BMET in 1 week.

## 2022-11-12 ENCOUNTER — Telehealth: Payer: Self-pay

## 2022-11-12 NOTE — Telephone Encounter (Signed)
Recommendations reviewed with pt as per [facility] note.  Pt verbalized understanding and had no additional questions.

## 2022-11-12 NOTE — Telephone Encounter (Signed)
Raynelle Fanning states that the pt has been rescheduled twice for her cardiac CT. Raynelle Fanning states she is concerned that the pt has a HR in the 90's and was in the ED yesterday for hypotension. Raynelle Fanning does not feel that she should have the CT at [facility].

## 2022-11-17 ENCOUNTER — Other Ambulatory Visit (HOSPITAL_COMMUNITY): Payer: Self-pay

## 2022-11-17 MED ORDER — AMPHETAMINE-DEXTROAMPHET ER 20 MG PO CP24
20.0000 mg | ORAL_CAPSULE | Freq: Every morning | ORAL | 0 refills | Status: AC
  Filled 2022-11-17 – 2022-11-19 (×3): qty 30, 30d supply, fill #0

## 2022-11-18 ENCOUNTER — Other Ambulatory Visit: Payer: Self-pay | Admitting: Cardiology

## 2022-11-19 ENCOUNTER — Other Ambulatory Visit (HOSPITAL_COMMUNITY): Payer: Self-pay

## 2022-11-20 ENCOUNTER — Other Ambulatory Visit (HOSPITAL_COMMUNITY): Payer: Self-pay | Admitting: *Deleted

## 2022-11-20 ENCOUNTER — Encounter (HOSPITAL_COMMUNITY): Payer: Self-pay

## 2022-11-20 ENCOUNTER — Telehealth (HOSPITAL_COMMUNITY): Payer: Self-pay | Admitting: *Deleted

## 2022-11-20 ENCOUNTER — Other Ambulatory Visit (HOSPITAL_COMMUNITY): Payer: Self-pay

## 2022-11-20 MED ORDER — METOPROLOL TARTRATE 100 MG PO TABS: ORAL_TABLET | ORAL | 0 refills | Status: DC

## 2022-11-20 NOTE — Telephone Encounter (Signed)
Reaching out to patient to offer assistance regarding upcoming cardiac imaging study; pt verbalizes understanding of appt date/time, parking situation and where to check in, pre-test NPO status and medications ordered, and verified current allergies; name and call back number provided for further questions should they arise  Larey Brick RN Navigator Cardiac Imaging [facility] [phone] office [phone] cell  Patient to hold her BP medication and adderall on day of test and take 100mg  metoprolol tartrate two hours prior to her cardiac CT scan. She is aware to arrive at 1 PM.

## 2022-11-24 ENCOUNTER — Ambulatory Visit (HOSPITAL_COMMUNITY)
Admission: RE | Admit: 2022-11-24 | Discharge: 2022-11-24 | Disposition: A | Discharge status: Home / Self Care | Payer: Medicare Other | Source: Ambulatory Visit | Attending: Cardiology | Admitting: Cardiology

## 2022-11-24 DIAGNOSIS — R072 Precordial pain: Secondary | ICD-10-CM | POA: Insufficient documentation

## 2022-11-24 DIAGNOSIS — I2510 Atherosclerotic heart disease of native coronary artery without angina pectoris: Secondary | ICD-10-CM | POA: Diagnosis not present

## 2022-11-24 MED ORDER — NITROGLYCERIN 0.4 MG SL SUBL
0.8000 mg | SUBLINGUAL_TABLET | Freq: Once | SUBLINGUAL | Status: AC
  Administered 2022-11-24: 0.8 mg via SUBLINGUAL

## 2022-11-24 MED ORDER — IOHEXOL 350 MG/ML SOLN
100.0000 mL | Freq: Once | INTRAVENOUS | Status: AC | PRN
  Administered 2022-11-24: 100 mL via INTRAVENOUS

## 2022-11-24 MED ORDER — NITROGLYCERIN 0.4 MG SL SUBL
SUBLINGUAL_TABLET | SUBLINGUAL | Status: AC
  Filled 2022-11-24: qty 2

## 2022-11-25 ENCOUNTER — Other Ambulatory Visit: Payer: Self-pay | Admitting: Cardiology

## 2022-11-25 ENCOUNTER — Ambulatory Visit (HOSPITAL_COMMUNITY)
Admission: RE | Admit: 2022-11-25 | Discharge: 2022-11-25 | Disposition: A | Discharge status: Home / Self Care | Payer: Medicare Other | Source: Ambulatory Visit | Attending: Cardiology | Admitting: Cardiology

## 2022-11-25 DIAGNOSIS — I2510 Atherosclerotic heart disease of native coronary artery without angina pectoris: Secondary | ICD-10-CM | POA: Diagnosis not present

## 2022-11-25 DIAGNOSIS — R931 Abnormal findings on diagnostic imaging of heart and coronary circulation: Secondary | ICD-10-CM | POA: Diagnosis present

## 2022-11-26 ENCOUNTER — Telehealth: Payer: Self-pay

## 2022-11-26 NOTE — Telephone Encounter (Signed)
Pt viewed results on My Chart per [facility] note. Routed to PCP

## 2022-12-01 ENCOUNTER — Other Ambulatory Visit (HOSPITAL_COMMUNITY): Payer: Self-pay

## 2022-12-01 MED ORDER — ROSUVASTATIN CALCIUM 20 MG PO TABS
20.0000 mg | ORAL_TABLET | Freq: Every day | ORAL | 12 refills | Status: AC
  Filled 2022-12-01: qty 30, 30d supply, fill #0

## 2022-12-09 ENCOUNTER — Ambulatory Visit: Payer: Medicare Other

## 2022-12-11 ENCOUNTER — Other Ambulatory Visit (HOSPITAL_COMMUNITY): Payer: Self-pay

## 2022-12-15 ENCOUNTER — Ambulatory Visit: Payer: Medicare Other | Admitting: Cardiology

## 2022-12-17 ENCOUNTER — Encounter: Payer: Self-pay | Admitting: Cardiology

## 2022-12-17 ENCOUNTER — Ambulatory Visit: Payer: Medicare Other | Attending: Cardiology | Admitting: Cardiology

## 2022-12-17 VITALS — BP 108/74 | HR 112 | Ht 62.0 in | Wt 133.0 lb

## 2022-12-17 DIAGNOSIS — G459 Transient cerebral ischemic attack, unspecified: Secondary | ICD-10-CM | POA: Diagnosis not present

## 2022-12-17 DIAGNOSIS — I2510 Atherosclerotic heart disease of native coronary artery without angina pectoris: Secondary | ICD-10-CM

## 2022-12-17 DIAGNOSIS — N179 Acute kidney failure, unspecified: Secondary | ICD-10-CM

## 2022-12-17 DIAGNOSIS — E785 Hyperlipidemia, unspecified: Secondary | ICD-10-CM | POA: Diagnosis not present

## 2022-12-17 DIAGNOSIS — R0609 Other forms of dyspnea: Secondary | ICD-10-CM

## 2022-12-17 DIAGNOSIS — I10 Essential (primary) hypertension: Secondary | ICD-10-CM | POA: Diagnosis not present

## 2022-12-17 DIAGNOSIS — Z720 Tobacco use: Secondary | ICD-10-CM

## 2022-12-17 DIAGNOSIS — Z79899 Other long term (current) drug therapy: Secondary | ICD-10-CM

## 2022-12-17 MED ORDER — NITROGLYCERIN 0.4 MG SL SUBL: 0.4000 mg | SUBLINGUAL_TABLET | SUBLINGUAL | 3 refills | Status: AC | PRN

## 2022-12-17 NOTE — Progress Notes (Signed)
Cardiology Office Note:  .   Date:  12/17/2022  ID:  Cristina Myers, DOB 1960-02-22, MRN [id number] PCP: Cristina Curia, MD  [facility] Providers Cardiologist:  Norman Herrlich, MD    History of Present Illness: [name] Kitchen   Cristina Myers is a 62 y.o. 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  11/13/2022 coronary CTA calcium score 2268, 99th percentile, FFR demonstrates possible hemodynamic significance.   11/25/2017 CT of the chest for lung cancer screening advanced coronary artery atherosclerosis noted 04/08/2020 echo EF 55 to 60%, mild concentric LVH, mitral valve leaflets mildly thickened with mild mitral annular calcification, moderate MR.  She established care with Dr. Dulce Sellar in October 2023 at the behest of her PCP for evaluation of chest pain.  It appears a coronary CTA was ordered but does not appear this was completed.  Did on 11/03/2022 for DOE, this have been bothersome for her for some time, coronary CTA been previously ordered but was not completed.  We arranged an echocardiogram and a coronary CTA, coronary CTA revealed a calcium score 2268, placing her in the 99th percentile, FFR demonstrates possible hemodynamic significance, recommendations were made she return for follow-up discussion however this was not completed.  Most recently, she was admitted to [facility] with anaphylactic shock, magnesium was noted to be low, unknown trigger for anaphylaxis--but it was felt that she possibly inadvertently took one of her husbands lisinopril.  She presents today accompanied by her son for follow-up.  She has had a rough few weeks, broke her leg, had to undergo surgery, and then ED visit for anaphylactic shock.  She saw her PCP today, all of  her antihypertensive agents were stopped secondary to hypotension.  She has had episodes of chest pain, states it is typically sharp in nature, she typically has to grab her chest and pain subsides within a minute, she also has episodes of shortness of breath however it appears to be mostly with exertion, she feels that is related to her smoking.  She has not had any other concerns that are cardiac in nature. She denies palpitations, pnd, orthopnea, n, v, dizziness, syncope, edema, weight gain, or early satiety.   ROS: Review of Systems  Constitutional:  Positive for malaise/fatigue.  Respiratory:  Positive for shortness of breath.   Cardiovascular:  Positive for chest pain.  Musculoskeletal:  Positive for joint pain.     Studies Reviewed: .        Cardiac Studies & Procedures       ECHOCARDIOGRAM  ECHOCARDIOGRAM COMPLETE 09/18/2014  Narrative *[place]* *[facility]* [address] [place], [place] [address] [phone]  ------------------------------------------------------------------- Transthoracic Echocardiography  Patient:    Cristina, Myers MR #:       [id number] Study Date: 09/18/2014 Gender:     F Age:        54 Height:     157.5 cm Weight:     53.1 kg BSA:        1.53 m^2 Pt. Status: Room:       5W26C  SONOGRAPHER  Cathie Beams ADMITTING    Cristina Myers ORDERING     Cristina Myers Myers REFERRING    Cristina Myers ATTENDING    Cristina Myers PERFORMING   [facility], Inpatient  cc:  -------------------------------------------------------------------  LV EF: 55% -   60%  ------------------------------------------------------------------- Indications:      TIA 435.9.  ------------------------------------------------------------------- History:   PMH:  Anxiety. Attention deficit hyperactivity disorder. Backache. Chronic prescription benzodiazepine use.  Risk factors: Current tobacco use. Hypertension.  Dyslipidemia.  ------------------------------------------------------------------- Study Conclusions  - Left ventricle: The cavity size was normal. Wall thickness was increased in a pattern of mild LVH. Systolic function was normal. The estimated ejection fraction was in the range of 55% to 60%. Wall motion was normal; there were no regional wall motion abnormalities. Doppler parameters are consistent with abnormal left ventricular relaxation (grade 1 diastolic dysfunction). - Aortic valve: There was no stenosis. - Mitral valve: There was trivial regurgitation. - Left atrium: The atrium was mildly dilated. - Right ventricle: The cavity size was normal. Systolic function was normal. - Pulmonary arteries: No complete TR doppler jet so unable to estimate PA systolic pressure. - Systemic veins: IVC measured 2.1 cm with > 50% respirophasic variation, suggesting RA pressure 8 mmHg.  Impressions:  - Normal LV size with mild LV hypertrophy, EF 55-60%. Normal RV size and systolic function. Mild LAE. No significant valvular abnormalities.  Transthoracic echocardiography.  M-mode, complete 2D, spectral Doppler, and color Doppler.  Birthdate:  Patient birthdate: [birth year].  Age:  Patient is 62 yr old.  Sex:  Gender: female. BMI: 21.4 kg/m^2.  Blood pressure:     153/97  Patient status: Inpatient.  Study date:  Study date: 09/18/2014. Study time: 02:54 PM.  Location:  Echo laboratory.  -------------------------------------------------------------------  ------------------------------------------------------------------- Left ventricle:  The cavity size was normal. Wall thickness was increased in a pattern of mild LVH. Systolic function was normal. The estimated ejection fraction was in the range of 55% to 60%. Wall motion was normal; there were no regional wall motion abnormalities. Doppler parameters are consistent with abnormal left ventricular relaxation (grade 1 diastolic  dysfunction).  ------------------------------------------------------------------- Aortic valve:   Trileaflet; mildly calcified leaflets.  Doppler: There was no stenosis.   There was no regurgitation.  ------------------------------------------------------------------- Aorta:  Aortic root: The aortic root was normal in size. Ascending aorta: The ascending aorta was normal in size.  ------------------------------------------------------------------- Mitral valve:   Normal thickness leaflets .  Doppler:   There was no evidence for stenosis.   There was trivial regurgitation. Peak gradient (Myers): 2 mm Hg.  ------------------------------------------------------------------- Left atrium:  The atrium was mildly dilated.  ------------------------------------------------------------------- Right ventricle:  The cavity size was normal. Systolic function was normal.  ------------------------------------------------------------------- Pulmonic valve:    Structurally normal valve.   Cusp separation was normal.  Doppler:  Transvalvular velocity was within the normal range. There was trivial regurgitation.  ------------------------------------------------------------------- Tricuspid valve:   Doppler:  There was trivial regurgitation.  ------------------------------------------------------------------- Pulmonary artery:   No complete TR doppler jet so unable to estimate PA systolic pressure.  ------------------------------------------------------------------- Right atrium:  The atrium was normal in size.  ------------------------------------------------------------------- Pericardium:  There was no pericardial effusion.  ------------------------------------------------------------------- Systemic veins:  IVC measured 2.1 cm with > 50% respirophasic variation, suggesting RA pressure 8 mmHg.  ------------------------------------------------------------------- Measurements  Left ventricle                          Value        Reference LV ID, ED, PLAX chordal        (L)     33.3  mm     43 - 52 LV ID, ES, PLAX chordal  23.3  mm     23 - 38 LV fx shortening, PLAX chordal         30    %      >=29 LV PW thickness, ED                    10.4  mm     --------- IVS/LV PW ratio, ED                    1.15         <=1.3 LV e&', lateral                         11.1  cm/s   --------- LV E/e&', lateral                       6.38         --------- LV e&', medial                          6.74  cm/s   --------- LV E/e&', medial                        10.5         --------- LV e&', average                         8.92  cm/s   --------- LV E/e&', average                       7.94         ---------  Ventricular septum                     Value        Reference IVS thickness, ED                      12    mm     ---------  LVOT                                   Value        Reference LVOT ID, S                             20    mm     --------- LVOT area                              3.14  cm^2   ---------  Aorta                                  Value        Reference Aortic root ID, ED                     30    mm     ---------  Left atrium  Value        Reference LA ID, A-P, ES                         30    mm     --------- LA ID/bsa, A-P                         1.97  cm/m^2 <=2.2 LA volume, S                           47.5  ml     --------- LA volume/bsa, S                       31.1  ml/m^2 --------- LA volume, ES, 1-p A4C                 39.7  ml     --------- LA volume/bsa, ES, 1-p A4C             26    ml/m^2 --------- LA volume, ES, 1-p A2C                 55.1  ml     --------- LA volume/bsa, ES, 1-p A2C             36.1  ml/m^2 ---------  Mitral valve                           Value        Reference Mitral E-wave peak velocity            70.8  cm/s   --------- Mitral A-wave peak velocity            59.2  cm/s   --------- Mitral  deceleration time       (H)     299   ms     150 - 230 Mitral peak gradient, Myers                2     mm Hg  --------- Mitral E/A ratio, peak                 1.2          ---------  Right ventricle                        Value        Reference RV s&', lateral, S                      12.4  cm/s   ---------  Pulmonic valve                         Value        Reference Pulmonic regurg velocity, ED           96.4  cm/s   ---------  Legend: (L)  and  (H)  mark values outside specified reference range.  ------------------------------------------------------------------- Prepared and Electronically Authenticated by  Marca Ancona, M.Myers. 2016-08-16T17:48:28     CT SCANS  CT CORONARY MORPH W/CTA COR W/SCORE 11/24/2022  Narrative CLINICAL DATA:  Chest pain  EXAM: Cardiac/Coronary CTA  TECHNIQUE: A non-contrast, gated CT scan was obtained with axial slices of 3 mm through  the heart for calcium scoring. Calcium scoring was performed using the Agatston method. A 120 kV prospective, gated, contrast cardiac scan was obtained. Gantry rotation speed was 250 msecs and collimation was 0.6 mm. Two sublingual nitroglycerin tablets (0.8 mg) were given. The 3D data set was reconstructed in 5% intervals of the 35-75% of the R-R cycle. Diastolic phases were analyzed on a dedicated workstation using MPR, MIP, and VRT modes. The patient received 95 cc of contrast.  FINDINGS: Image quality:Poor. Significant slab artifact is present. Significant blooming artifact.  Noise artifact is: Limited.  Coronary Arteries:  Normal coronary origin.  Right dominance.  Left main: The left main is a large caliber vessel with a normal take off from the left coronary cusp that bifurcates to form a left anterior descending artery and a left circumflex artery. There is no plaque or stenosis.  Left anterior descending artery: The LAD gives off 2 patent diagonal branches. There LAD is diffusely diseased and  heavily calcified. There is severe calcified plaque in the proximal to mid LAD with associated stenosis of 70-99%.  Left circumflex artery: The LCX is non-dominant and gives off 2 patent obtuse marginal branches. The LCx is heavily calcified throughout the proximal and mid LCx with associated stenosis of at least 50-69%.  Right coronary artery: The RCA is dominant with normal take off from the right coronary cusp. The RCA is heavily calcified in the proximal and mid portion with associated stenosis of at least 50-69% but likely >70%. The RCA terminates as a PDA and right posterolateral branch without evidence of plaque or stenosis.  Right Atrium: Right atrial size is within normal limits.  Right Ventricle: The right ventricular cavity is within normal limits.  Left Atrium: Left atrial size is normal in size with no left atrial appendage filling defect.  Left Ventricle: The ventricular cavity size is within normal limits.  Pulmonary arteries: Normal in size.  Pulmonary veins: Normal pulmonary venous drainage.  Pericardium: Normal thickness without significant effusion or calcium present.  Cardiac valves: The aortic valve is trileaflet without significant calcification. The mitral valve is normal with moderate mitral annular calcifications.  Aorta: Normal caliber with scattered calcifications.  Extra-cardiac findings: See attached radiology report for non-cardiac structures.  IMPRESSION: 1. Coronary calcium score of 2268. This was 99th percentile for age-, sex, and race-matched controls.  2. Total plaque volume 1260 mm3 which is 98th percentile for age- and sex-matched controls (calcified plaque 23mm3; non-calcified plaque 1065mm3). TPV is extensive.  3. Normal coronary origin with right dominance.  4. Severe atherosclerosis. Severe stenosis of the prox to mid LAD, moderate to severe stenosis of the prox to mid RCA and moderate stenosis of the LCx  5. Consider  symptom-guided anti-ischemic and preventive pharmacotherapy as well as risk factor modification per guideline-directed care.  6. Recommend cardiac cath.  7. This study has been submitted for FFR analysis.  RECOMMENDATIONS: 1. CAD-RADS 0: No evidence of CAD (0%). Consider non-atherosclerotic causes of chest pain.  2. CAD-RADS 1: Minimal non-obstructive CAD (0-24%). Consider non-atherosclerotic causes of chest pain. Consider preventive therapy and risk factor modification.  3. CAD-RADS 2: Mild non-obstructive CAD (25-49%). Consider non-atherosclerotic causes of chest pain. Consider preventive therapy and risk factor modification.  4. CAD-RADS 3: Moderate stenosis. Consider symptom-guided anti-ischemic pharmacotherapy as well as risk factor modification per guideline directed care. Additional analysis with CT FFR will be submitted.  5. CAD-RADS 4: Severe stenosis. (70-99% or > 50% left main). Cardiac catheterization or CT FFR is recommended.  Consider symptom-guided anti-ischemic pharmacotherapy as well as risk factor modification per guideline directed care. Invasive coronary angiography recommended with revascularization per published guideline statements.  6. CAD-RADS 5: Total coronary occlusion (100%). Consider cardiac catheterization or viability assessment. Consider symptom-guided anti-ischemic pharmacotherapy as well as risk factor modification per guideline directed care.  7. CAD-RADS N: Non-diagnostic study. Obstructive CAD can't be excluded. Alternative evaluation is recommended.  Armanda Magic, MD   Electronically Signed By: Armanda Magic M.Myers. On: 11/25/2022 19:58          Risk Assessment/Calculations:             Physical Exam:   VS:  BP 108/74 (BP Location: Right Arm, Patient Position: Sitting, Cuff Size: Normal)   Pulse (!) 112   Ht 5\' 2"  (1.575 m)   Wt 133 lb (60.3 kg)   SpO2 99%   BMI 24.33 kg/m    Wt Readings from Last 3 Encounters:  12/17/22  133 lb (60.3 kg)  11/03/22 132 lb (59.9 kg)  10/08/22 135 lb (61.2 kg)    GEN: Well nourished, well developed in no acute distress NECK: No JVD; No carotid bruits CARDIAC: RRR, no murmurs, rubs, gallops RESPIRATORY:  Clear to auscultation without rales, wheezing or rhonchi  ABDOMEN: Soft, non-tender, non-distended EXTREMITIES:  No edema; No deformity   ASSESSMENT AND PLAN: .   CAD-coronary CTA revealed a calcium score of 2268, placing her in the 99th percentile, FFR demonstrated possibly hemodynamic significance.  She is having episodes of chest pain, typically sharp in nature and relieved after approximately 1 minute.  Reviewed with Dr. Dulce Sellar, for now we will try to optimize her medical therapy. Start ASA 81 mg daily. Continue Crestor 20 mg daily. Start NTG PRN -- reviewed indications for how to take and when to present to the ED.  DOE - likely multifactorial, related to COPD, tobacco abuse, deconditioning from a recent fall.  She had a coronary CTA which did reveal coronary artery disease, FFR history of possible hemodynamic significance. We previously arranged an echocardiogram however she had to cancel this secondary to being hospitalized.  Will arrange for repeat echocardiogram, but it seems like it is more likely related to her COPD.   COPD - not clear who follows this, she had stopped smoking but resumed with stressors she has been under, only smoking a third of a pack per day which is significantly decreased for her. On Trelegy and abluterol. If cardiac work up is unrevealing for causes of DOE, will refer or make sure she is seeing pulmonology.   Hypertension-blood pressure is 108/74, all of her antihypertensives were stopped by her PCP earlier today.  Moderate MR - last evaluated in 2022, will repeat echocardiogram for surveillance.   Dyslipidemia-most recent LDL was elevated at 140, we started on Crestor approximately a month ago, will repeat FLP and LFTs.  Hypomagnesemia-magnesium  was low at 1.1, she is starting magnesium supplementation today.  Will arrange to repeat her magnesium level in 1 week.  AKI on CKD-creatinine was elevated 2.70 on 12/15/2022, again her antihypertensives have been discontinued.  Will repeat kidney function in 1 week.  Nephrotoxic agents have been discontinued.  Tobacco abuse-is trying to stop, is currently smoking a third a pack per day, she is aware of the deleterious effects this has on her body.       Dispo: Start aspirin 81 mg daily, nitroglycerin as needed.  In 1 week we will repeat magnesium, CMET, fasting lipid panel.  Follow-up in 4  weeks.  Signed, Flossie Dibble, NP

## 2022-12-17 NOTE — Patient Instructions (Addendum)
Medication Instructions: Your physician has recommended you make the following change in your medication:  Start Enteric Coated 81 mg Aspirin once daily Nitroglycerin 0.4 mg sublingual (under your tongue) as needed for chest pain. If experiencing chest pain, stop what you are doing and sit down. Take 1 nitroglycerin and wait 5 minutes. If chest pain continues, take another nitroglycerin and wait 5 minutes. If chest pain does not subside, take 1 more nitroglycerin and dial 911. You make take a total of 3 nitroglycerin in a 15 minute time frame.     *If you need a refill on your cardiac medications before your next appointment, please call your pharmacy*   Lab Work: Your physician recommends that you return for lab work in: 1 Week for a CMP, Magnesium and Fasting Lipid Panel Lab opens at 8am. You DO NOT NEED an appointment. Best time to come is between 8am and 11:45 and between 1:30 and 4:30. If you have been asked to fast for your blood work please have nothing to eat or drink after midnight. You may have water.   If you have labs (blood work) drawn today and your tests are completely normal, you will receive your results only by: MyChart Message (if you have MyChart) OR A paper copy in the mail If you have any lab test that is abnormal or we need to change your treatment, we will call you to review the results.   Testing/Procedures: Your physician has requested that you have an echocardiogram. Echocardiography is a painless test that uses sound waves to create images of your heart. It provides your doctor with information about the size and shape of your heart and how well your heart's chambers and valves are working. This procedure takes approximately one hour. There are no restrictions for this procedure. Please do NOT wear cologne, perfume, aftershave, or lotions (deodorant is allowed). Please arrive 15 minutes prior to your appointment time.  Please note: We ask at that you not bring  children with you during ultrasound (echo/ vascular) testing. Due to room size and safety concerns, children are not allowed in the ultrasound rooms during exams. Our front office staff cannot provide observation of children in our lobby area while testing is being conducted. An adult accompanying a patient to their appointment will only be allowed in the ultrasound room at the discretion of the ultrasound technician under special circumstances. We apologize for any inconvenience.    Follow-Up: At [facility] [place], you and your health needs are our priority.  As part of our continuing mission to provide you with exceptional heart care, we have created designated Provider Care Teams.  These Care Teams include your primary Cardiologist (physician) and Advanced Practice Providers (APPs -  Physician Assistants and Nurse Practitioners) who all work together to provide you with the care you need, when you need it.  We recommend signing up for the patient portal called "MyChart".  Sign up information is provided on this After Visit Summary.  MyChart is used to connect with patients for Virtual Visits (Telemedicine).  Patients are able to view lab/test results, encounter notes, upcoming appointments, etc.  Non-urgent messages can be sent to your provider as well.   To learn more about what you can do with MyChart, go to [email or web address].    Your next appointment:   1 month(s)  Provider:   Wallis Bamberg, NP Rosalita Levan)    Other Instructions

## 2023-01-19 NOTE — Progress Notes (Deleted)
Cardiology Office Note:  .   Date:  01/19/2023  ID:  Cristina Myers, DOB [birth year], MRN [id number] PCP: Simone Curia, MD  [facility] Providers Cardiologist:  Norman Herrlich, MD    History of Present Illness: [name] Kitchen   Cristina Myers is a 62 y.o. 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  11/13/2022 coronary CTA calcium score 2268, 99th percentile, FFR demonstrates possible hemodynamic significance.   11/25/2017 CT of the chest for lung cancer screening advanced coronary artery atherosclerosis noted 04/08/2020 echo EF 55 to 60%, mild concentric LVH, mitral valve leaflets mildly thickened with mild mitral annular calcification, moderate MR.  She established care with Dr. Dulce Sellar in October 2023 at the behest of her PCP for evaluation of chest pain.  It appears a coronary CTA was ordered but does not appear this was completed.  Evaluated on 11/03/2022 for DOE, this have been bothersome for her for some time, coronary CTA been previously ordered but was not completed.  We arranged an echocardiogram and a coronary CTA, coronary CTA revealed a calcium score 2268, placing her in the 99th percentile, FFR demonstrates possible hemodynamic significance, recommendations were made she return for follow-up discussion however this was not completed.  Most recently, she was admitted to [facility] with anaphylactic shock, magnesium was noted to be low, unknown trigger for anaphylaxis--but it was felt that she possibly inadvertently took one of her husbands lisinopril.  She presents today accompanied by her son for follow-up.  She has had a rough few weeks, broke her leg, had to undergo surgery, and then ED visit for anaphylactic shock.  She saw her PCP today,  all of her antihypertensive agents were stopped secondary to hypotension.  She has had episodes of chest pain, states it is typically sharp in nature, she typically has to grab her chest and pain subsides within a minute, she also has episodes of shortness of breath however it appears to be mostly with exertion, she feels that is related to her smoking.  She has not had any other concerns that are cardiac in nature. She denies palpitations, pnd, orthopnea, n, v, dizziness, syncope, edema, weight gain, or early satiety.   ROS: Review of Systems  Constitutional:  Positive for malaise/fatigue.  Respiratory:  Positive for shortness of breath.   Cardiovascular:  Positive for chest pain.  Musculoskeletal:  Positive for joint pain.     Studies Reviewed: .        Cardiac Studies & Procedures      ECHOCARDIOGRAM  ECHOCARDIOGRAM COMPLETE 09/18/2014  Narrative *[place]* *[facility]* [address] [place], [place] [address] [phone]  ------------------------------------------------------------------- Transthoracic Echocardiography  Patient:    Cristina, Myers MR #:       [id number] Study Date: 09/18/2014 Gender:     F Age:        54 Height:     157.5 cm Weight:     53.1 kg BSA:        1.53 m^2 Pt. Status: Room:       [id number]  SONOGRAPHER  Cathie Beams ADMITTING    Marcellus Scott D ORDERING     [place], [place] D REFERRING    [place] D ATTENDING    Joseph Art PERFORMING   [facility], Inpatient  cc:  -------------------------------------------------------------------  LV EF: 55% -   60%  ------------------------------------------------------------------- Indications:      TIA 435.9.  ------------------------------------------------------------------- History:   PMH:  Anxiety. Attention deficit hyperactivity disorder. Backache. Chronic prescription benzodiazepine use.  Risk factors: Current tobacco use. Hypertension.  Dyslipidemia.  ------------------------------------------------------------------- Study Conclusions  - Left ventricle: The cavity size was normal. Wall thickness was increased in a pattern of mild LVH. Systolic function was normal. The estimated ejection fraction was in the range of 55% to 60%. Wall motion was normal; there were no regional wall motion abnormalities. Doppler parameters are consistent with abnormal left ventricular relaxation (grade 1 diastolic dysfunction). - Aortic valve: There was no stenosis. - Mitral valve: There was trivial regurgitation. - Left atrium: The atrium was mildly dilated. - Right ventricle: The cavity size was normal. Systolic function was normal. - Pulmonary arteries: No complete TR doppler jet so unable to estimate PA systolic pressure. - Systemic veins: IVC measured 2.1 cm with > 50% respirophasic variation, suggesting RA pressure 8 mmHg.  Impressions:  - Normal LV size with mild LV hypertrophy, EF 55-60%. Normal RV size and systolic function. Mild LAE. No significant valvular abnormalities.  Transthoracic echocardiography.  M-mode, complete 2D, spectral Doppler, and color Doppler.  Birthdate:  Patient birthdate: 1960/05/11.  Age:  Patient is 63 yr old.  Sex:  Gender: female. BMI: 21.4 kg/m^2.  Blood pressure:     153/97  Patient status: Inpatient.  Study date:  Study date: 09/18/2014. Study time: 02:54 PM.  Location:  Echo laboratory.  -------------------------------------------------------------------  ------------------------------------------------------------------- Left ventricle:  The cavity size was normal. Wall thickness was increased in a pattern of mild LVH. Systolic function was normal. The estimated ejection fraction was in the range of 55% to 60%. Wall motion was normal; there were no regional wall motion abnormalities. Doppler parameters are consistent with abnormal left ventricular relaxation (grade 1 diastolic  dysfunction).  ------------------------------------------------------------------- Aortic valve:   Trileaflet; mildly calcified leaflets.  Doppler: There was no stenosis.   There was no regurgitation.  ------------------------------------------------------------------- Aorta:  Aortic root: The aortic root was normal in size. Ascending aorta: The ascending aorta was normal in size.  ------------------------------------------------------------------- Mitral valve:   Normal thickness leaflets .  Doppler:   There was no evidence for stenosis.   There was trivial regurgitation. Peak gradient (D): 2 mm Hg.  ------------------------------------------------------------------- Left atrium:  The atrium was mildly dilated.  ------------------------------------------------------------------- Right ventricle:  The cavity size was normal. Systolic function was normal.  ------------------------------------------------------------------- Pulmonic valve:    Structurally normal valve.   Cusp separation was normal.  Doppler:  Transvalvular velocity was within the normal range. There was trivial regurgitation.  ------------------------------------------------------------------- Tricuspid valve:   Doppler:  There was trivial regurgitation.  ------------------------------------------------------------------- Pulmonary artery:   No complete TR doppler jet so unable to estimate PA systolic pressure.  ------------------------------------------------------------------- Right atrium:  The atrium was normal in size.  ------------------------------------------------------------------- Pericardium:  There was no pericardial effusion.  ------------------------------------------------------------------- Systemic veins:  IVC measured 2.1 cm with > 50% respirophasic variation, suggesting RA pressure 8 mmHg.  ------------------------------------------------------------------- Measurements  Left ventricle                          Value        Reference LV ID, ED, PLAX chordal        (L)     33.3  mm     43 - 52 LV ID, ES, PLAX chordal  23.3  mm     23 - 38 LV fx shortening, PLAX chordal         30    %      >=29 LV PW thickness, ED                    10.4  mm     --------- IVS/LV PW ratio, ED                    1.15         <=1.3 LV e&', lateral                         11.1  cm/s   --------- LV E/e&', lateral                       6.38         --------- LV e&', medial                          6.74  cm/s   --------- LV E/e&', medial                        10.5         --------- LV e&', average                         8.92  cm/s   --------- LV E/e&', average                       7.94         ---------  Ventricular septum                     Value        Reference IVS thickness, ED                      12    mm     ---------  LVOT                                   Value        Reference LVOT ID, S                             20    mm     --------- LVOT area                              3.14  cm^2   ---------  Aorta                                  Value        Reference Aortic root ID, ED                     30    mm     ---------  Left atrium  Value        Reference LA ID, A-P, ES                         30    mm     --------- LA ID/bsa, A-P                         1.97  cm/m^2 <=2.2 LA volume, S                           47.5  ml     --------- LA volume/bsa, S                       31.1  ml/m^2 --------- LA volume, ES, 1-p A4C                 39.7  ml     --------- LA volume/bsa, ES, 1-p A4C             26    ml/m^2 --------- LA volume, ES, 1-p A2C                 55.1  ml     --------- LA volume/bsa, ES, 1-p A2C             36.1  ml/m^2 ---------  Mitral valve                           Value        Reference Mitral E-wave peak velocity            70.8  cm/s   --------- Mitral A-wave peak velocity            59.2  cm/s   --------- Mitral  deceleration time       (H)     299   ms     150 - 230 Mitral peak gradient, D                2     mm Hg  --------- Mitral E/A ratio, peak                 1.2          ---------  Right ventricle                        Value        Reference RV s&', lateral, S                      12.4  cm/s   ---------  Pulmonic valve                         Value        Reference Pulmonic regurg velocity, ED           96.4  cm/s   ---------  Legend: (L)  and  (H)  mark values outside specified reference range.  ------------------------------------------------------------------- Prepared and Electronically Authenticated by  Marca Ancona, M.D. 2016-08-16T17:48:28    CT SCANS  CT CORONARY MORPH W/CTA COR W/SCORE 11/24/2022  Addendum 12/19/2022 11:41 PM ADDENDUM REPORT: 12/19/2022 23:38  EXAM: OVER-READ INTERPRETATION  CT CHEST  The following report is an over-read performed by radiologist Dr. Loraine Leriche  [place] of [facility], [place] on 12/19/2022. This over-read does not include interpretation of cardiac or coronary anatomy or pathology. The coronary calcium score/coronary CTA interpretation by the cardiologist is attached.  COMPARISON:  None.  FINDINGS: Cardiovascular: There are no significant extracardiac vascular findings.  Mediastinum/Nodes: There are no enlarged lymph nodes within the visualized mediastinum.  Lungs/Pleura: There is no pleural effusion. The visualized lungs appear clear.  Upper abdomen: Small sliding-type hiatal hernia is noted.  Musculoskeletal/Chest wall: No chest wall mass or suspicious osseous findings within the visualized chest.  IMPRESSION: Hiatal hernia.   Electronically Signed By: Alcide Clever M.D. On: 12/19/2022 23:38  Narrative CLINICAL DATA:  Chest pain  EXAM: Cardiac/Coronary CTA  TECHNIQUE: A non-contrast, gated CT scan was obtained with axial slices of 3 mm through the heart for calcium scoring. Calcium scoring was performed using  the Agatston method. A 120 kV prospective, gated, contrast cardiac scan was obtained. Gantry rotation speed was 250 msecs and collimation was 0.6 mm. Two sublingual nitroglycerin tablets (0.8 mg) were given. The 3D data set was reconstructed in 5% intervals of the 35-75% of the R-R cycle. Diastolic phases were analyzed on a dedicated workstation using MPR, MIP, and VRT modes. The patient received 95 cc of contrast.  FINDINGS: Image quality:Poor. Significant slab artifact is present. Significant blooming artifact.  Noise artifact is: Limited.  Coronary Arteries:  Normal coronary origin.  Right dominance.  Left main: The left main is a large caliber vessel with a normal take off from the left coronary cusp that bifurcates to form a left anterior descending artery and a left circumflex artery. There is no plaque or stenosis.  Left anterior descending artery: The LAD gives off 2 patent diagonal branches. There LAD is diffusely diseased and heavily calcified. There is severe calcified plaque in the proximal to mid LAD with associated stenosis of 70-99%.  Left circumflex artery: The LCX is non-dominant and gives off 2 patent obtuse marginal branches. The LCx is heavily calcified throughout the proximal and mid LCx with associated stenosis of at least 50-69%.  Right coronary artery: The RCA is dominant with normal take off from the right coronary cusp. The RCA is heavily calcified in the proximal and mid portion with associated stenosis of at least 50-69% but likely >70%. The RCA terminates as a PDA and right posterolateral branch without evidence of plaque or stenosis.  Right Atrium: Right atrial size is within normal limits.  Right Ventricle: The right ventricular cavity is within normal limits.  Left Atrium: Left atrial size is normal in size with no left atrial appendage filling defect.  Left Ventricle: The ventricular cavity size is within normal limits.  Pulmonary  arteries: Normal in size.  Pulmonary veins: Normal pulmonary venous drainage.  Pericardium: Normal thickness without significant effusion or calcium present.  Cardiac valves: The aortic valve is trileaflet without significant calcification. The mitral valve is normal with moderate mitral annular calcifications.  Aorta: Normal caliber with scattered calcifications.  Extra-cardiac findings: See attached radiology report for non-cardiac structures.  IMPRESSION: 1. Coronary calcium score of 2268. This was 99th percentile for age-, sex, and race-matched controls.  2. Total plaque volume 1260 mm3 which is 98th percentile for age- and sex-matched controls (calcified plaque 275mm3; non-calcified plaque 1023mm3). TPV is extensive.  3. Normal coronary origin with right dominance.  4. Severe atherosclerosis. Severe stenosis of the prox to mid LAD, moderate to severe stenosis of the prox to mid RCA and moderate stenosis of the LCx  5. Consider symptom-guided anti-ischemic and preventive pharmacotherapy as well as risk factor modification per guideline-directed care.  6. Recommend cardiac cath.  7. This study has been submitted for FFR analysis.  RECOMMENDATIONS: 1. CAD-RADS 0: No evidence of CAD (0%). Consider non-atherosclerotic causes of chest pain.  2. CAD-RADS 1: Minimal non-obstructive CAD (0-24%). Consider non-atherosclerotic causes of chest pain. Consider preventive therapy and risk factor modification.  3. CAD-RADS 2: Mild non-obstructive CAD (25-49%). Consider non-atherosclerotic causes of chest pain. Consider preventive therapy and risk factor modification.  4. CAD-RADS 3: Moderate stenosis. Consider symptom-guided anti-ischemic pharmacotherapy as well as risk factor modification per guideline directed care. Additional analysis with CT FFR will be submitted.  5. CAD-RADS 4: Severe stenosis. (70-99% or > 50% left main). Cardiac catheterization or CT FFR is  recommended. Consider symptom-guided anti-ischemic pharmacotherapy as well as risk factor modification per guideline directed care. Invasive coronary angiography recommended with revascularization per published guideline statements.  6. CAD-RADS 5: Total coronary occlusion (100%). Consider cardiac catheterization or viability assessment. Consider symptom-guided anti-ischemic pharmacotherapy as well as risk factor modification per guideline directed care.  7. CAD-RADS N: Non-diagnostic study. Obstructive CAD can't be excluded. Alternative evaluation is recommended.  Armanda Magic, MD  Electronically Signed: By: Armanda Magic M.D. On: 11/25/2022 19:58          Risk Assessment/Calculations:     No BP recorded.  {Refresh Note OR Click here to enter BP  :1}***       Physical Exam:   VS:  There were no vitals taken for this visit.   Wt Readings from Last 3 Encounters:  12/17/22 133 lb (60.3 kg)  11/03/22 132 lb (59.9 kg)  10/08/22 135 lb (61.2 kg)    GEN: Well nourished, well developed in no acute distress NECK: No JVD; No carotid bruits CARDIAC: RRR, no murmurs, rubs, gallops RESPIRATORY:  Clear to auscultation without rales, wheezing or rhonchi  ABDOMEN: Soft, non-tender, non-distended EXTREMITIES:  No edema; No deformity   ASSESSMENT AND PLAN: .   CAD-coronary CTA revealed a calcium score of 2268, placing her in the 99th percentile, FFR demonstrated possibly hemodynamic significance.  She is having episodes of chest pain, typically sharp in nature and relieved after approximately 1 minute.  Reviewed with Dr. Dulce Sellar, for now we will try to optimize her medical therapy. Start ASA 81 mg daily. Continue Crestor 20 mg daily. Start NTG PRN -- reviewed indications for how to take and when to present to the ED.  DOE - likely multifactorial, related to COPD, tobacco abuse, deconditioning from a recent fall.  She had a coronary CTA which did reveal coronary artery disease, FFR history of  possible hemodynamic significance. We previously arranged an echocardiogram however she had to cancel this secondary to being hospitalized.  Will arrange for repeat echocardiogram, but it seems like it is more likely related to her COPD.   COPD - not clear who follows this, she had stopped smoking but resumed with stressors she has been under, only smoking a third of a pack per day which is significantly decreased for her. On Trelegy and abluterol. If cardiac work up is unrevealing for causes of DOE, will refer or make sure she is seeing pulmonology.   Hypertension-blood pressure is 108/74, all of her antihypertensives were stopped by her PCP earlier today.  Moderate MR - last evaluated in 2022, will repeat echocardiogram for surveillance.   Dyslipidemia-most recent LDL was elevated at 140, we started on Crestor approximately a month ago, will  repeat FLP and LFTs.  Hypomagnesemia-magnesium was low at 1.1, she is starting magnesium supplementation today.  Will arrange to repeat her magnesium level in 1 week.  AKI on CKD-creatinine was elevated 2.70 on 12/15/2022, again her antihypertensives have been discontinued.  Will repeat kidney function in 1 week.  Nephrotoxic agents have been discontinued.  Tobacco abuse-is trying to stop, is currently smoking a third a pack per day, she is aware of the deleterious effects this has on her body.       Dispo: Start aspirin 81 mg daily, nitroglycerin as needed.  In 1 week we will repeat magnesium, CMET, fasting lipid panel.  Follow-up in 4 weeks.  Signed, Flossie Dibble, NP

## 2023-01-20 ENCOUNTER — Other Ambulatory Visit: Payer: Medicare Other

## 2023-01-21 ENCOUNTER — Ambulatory Visit: Payer: Medicare Other

## 2023-01-22 ENCOUNTER — Ambulatory Visit: Payer: Medicare Other | Admitting: Cardiology

## 2023-02-10 ENCOUNTER — Ambulatory Visit: Payer: Medicare Other | Attending: Cardiology

## 2023-02-10 NOTE — Progress Notes (Deleted)
 Cardiology Office Note:  .   Date:  02/10/2023  ID:  Cristina Myers, DOB 1961/01/17, MRN [id number] PCP: Jama Chow, MD  [facility] Providers Cardiologist:  Redell Leiter, MD    History of Present Illness: Cristina   Garry Myers is a 63 y.o. 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  11/13/2022 coronary CTA calcium  score 2268, 99th percentile, FFR demonstrates possible hemodynamic significance.   11/25/2017 CT of the chest for lung cancer screening advanced coronary artery atherosclerosis noted 04/08/2020 echo EF 55 to 60%, mild concentric LVH, mitral valve leaflets mildly thickened with mild mitral annular calcification, moderate MR.  She established care with Dr. Leiter in October 2023 at the behest of her PCP for evaluation of chest pain.  It appears a coronary CTA was ordered but does not appear this was completed.  Evaluated on 11/03/2022 for DOE, this have been bothersome for her for some time, coronary CTA been previously ordered but was not completed.  We arranged an echocardiogram and a coronary CTA, coronary CTA revealed a calcium  score 2268, placing her in the 99th percentile, FFR demonstrates possible hemodynamic significance, recommendations were made she return for follow-up discussion however this was not completed.  She was admitted to [facility] with anaphylactic shock, magnesium was noted to be low, unknown trigger for anaphylaxis--but it was felt that she possibly inadvertently took one of her husbands lisinopril.  Evaluated 12/17/2022 accompanied by her son for follow-up.  She has had a rough few weeks, broke her leg, had to undergo surgery, and then ED visit for anaphylactic shock.  Had been dealing with hypotension,  evaluated by her PCP and her antihypertensives has been stopped. she saw her PCP today, all of her antihypertensive agents were stopped secondary to hypotension.    Dispo: Start aspirin  81 mg daily, nitroglycerin  as needed.  In 1 week we will repeat magnesium, CMET, fasting lipid panel.  Follow-up in 4 weeks.  ROS: Review of Systems  Constitutional:  Positive for malaise/fatigue.  Respiratory:  Positive for shortness of breath.   Cardiovascular:  Positive for chest pain.  Musculoskeletal:  Positive for joint pain.     Studies Reviewed: .        Cardiac Studies & Procedures      ECHOCARDIOGRAM  ECHOCARDIOGRAM COMPLETE 09/18/2014  Narrative *[place]* *[facility]* [address] [place], [place] [address] [phone]  ------------------------------------------------------------------- Transthoracic Echocardiography  Patient:    Cristina, Myers MR #:       [id number] Study Date: 09/18/2014 Gender:     F Age:        54 Height:     157.5 cm Weight:     53.1 kg BSA:        1.53 m^2 Pt. Status: Room:       [id number]  SONOGRAPHER  Jon Hacker ADMITTING    Judeth Lulas D ORDERING     [place], [place] D REFERRING    [place], [place] D ATTENDING    Juvenal Raisin U PERFORMING   [facility], Inpatient  cc:  ------------------------------------------------------------------- LV EF: 55% -   60%  ------------------------------------------------------------------- Indications:      TIA 435.9.  ------------------------------------------------------------------- History:   PMH:  Anxiety. Attention deficit hyperactivity disorder. Backache. Chronic prescription benzodiazepine use.  Risk factors: Current tobacco  use. Hypertension. Dyslipidemia.  ------------------------------------------------------------------- Study Conclusions  - Left ventricle: The cavity size was normal. Wall thickness was increased in a pattern of mild LVH. Systolic function was  normal. The estimated ejection fraction was in the range of 55% to 60%. Wall motion was normal; there were no regional wall motion abnormalities. Doppler parameters are consistent with abnormal left ventricular relaxation (grade 1 diastolic dysfunction). - Aortic valve: There was no stenosis. - Mitral valve: There was trivial regurgitation. - Left atrium: The atrium was mildly dilated. - Right ventricle: The cavity size was normal. Systolic function was normal. - Pulmonary arteries: No complete TR doppler jet so unable to estimate PA systolic pressure. - Systemic veins: IVC measured 2.1 cm with > 50% respirophasic variation, suggesting RA pressure 8 mmHg.  Impressions:  - Normal LV size with mild LV hypertrophy, EF 55-60%. Normal RV size and systolic function. Mild LAE. No significant valvular abnormalities.  Transthoracic echocardiography.  M-mode, complete 2D, spectral Doppler, and color Doppler.  Birthdate:  Patient birthdate: 07/31/1960.  Age:  Patient is 63 yr old.  Sex:  Gender: female. BMI: 21.4 kg/m^2.  Blood pressure:     153/97  Patient status: Inpatient.  Study date:  Study date: 09/18/2014. Study time: 02:54 PM.  Location:  Echo laboratory.  -------------------------------------------------------------------  ------------------------------------------------------------------- Left ventricle:  The cavity size was normal. Wall thickness was increased in a pattern of mild LVH. Systolic function was normal. The estimated ejection fraction was in the range of 55% to 60%. Wall motion was normal; there were no regional wall motion abnormalities. Doppler parameters are consistent with abnormal left ventricular relaxation (grade 1 diastolic dysfunction).  ------------------------------------------------------------------- Aortic valve:   Trileaflet; mildly calcified leaflets.  Doppler: There was no stenosis.   There was no  regurgitation.  ------------------------------------------------------------------- Aorta:  Aortic root: The aortic root was normal in size. Ascending aorta: The ascending aorta was normal in size.  ------------------------------------------------------------------- Mitral valve:   Normal thickness leaflets .  Doppler:   There was no evidence for stenosis.   There was trivial regurgitation. Peak gradient (D): 2 mm Hg.  ------------------------------------------------------------------- Left atrium:  The atrium was mildly dilated.  ------------------------------------------------------------------- Right ventricle:  The cavity size was normal. Systolic function was normal.  ------------------------------------------------------------------- Pulmonic valve:    Structurally normal valve.   Cusp separation was normal.  Doppler:  Transvalvular velocity was within the normal range. There was trivial regurgitation.  ------------------------------------------------------------------- Tricuspid valve:   Doppler:  There was trivial regurgitation.  ------------------------------------------------------------------- Pulmonary artery:   No complete TR doppler jet so unable to estimate PA systolic pressure.  ------------------------------------------------------------------- Right atrium:  The atrium was normal in size.  ------------------------------------------------------------------- Pericardium:  There was no pericardial effusion.  ------------------------------------------------------------------- Systemic veins:  IVC measured 2.1 cm with > 50% respirophasic variation, suggesting RA pressure 8 mmHg.  ------------------------------------------------------------------- Measurements  Left ventricle                         Value        Reference LV ID, ED, PLAX chordal        (L)     33.3  mm     43 - 52 LV ID, ES, PLAX chordal                23.3  mm     23 - 38 LV fx shortening,  PLAX chordal         30    %      >=  29 LV PW thickness, ED                    10.4  mm     --------- IVS/LV PW ratio, ED                    1.15         <=1.3 LV e&', lateral                         11.1  cm/s   --------- LV E/e&', lateral                       6.38         --------- LV e&', medial                          6.74  cm/s   --------- LV E/e&', medial                        10.5         --------- LV e&', average                         8.92  cm/s   --------- LV E/e&', average                       7.94         ---------  Ventricular septum                     Value        Reference IVS thickness, ED                      12    mm     ---------  LVOT                                   Value        Reference LVOT ID, S                             20    mm     --------- LVOT area                              3.14  cm^2   ---------  Aorta                                  Value        Reference Aortic root ID, ED                     30    mm     ---------  Left atrium                            Value        Reference LA ID, A-P, ES  30    mm     --------- LA ID/bsa, A-P                         1.97  cm/m^2 <=2.2 LA volume, S                           47.5  ml     --------- LA volume/bsa, S                       31.1  ml/m^2 --------- LA volume, ES, 1-p A4C                 39.7  ml     --------- LA volume/bsa, ES, 1-p A4C             26    ml/m^2 --------- LA volume, ES, 1-p A2C                 55.1  ml     --------- LA volume/bsa, ES, 1-p A2C             36.1  ml/m^2 ---------  Mitral valve                           Value        Reference Mitral E-wave peak velocity            70.8  cm/s   --------- Mitral A-wave peak velocity            59.2  cm/s   --------- Mitral deceleration time       (H)     299   ms     150 - 230 Mitral peak gradient, D                2     mm Hg  --------- Mitral E/A ratio, peak                 1.2          ---------  Right  ventricle                        Value        Reference RV s&', lateral, S                      12.4  cm/s   ---------  Pulmonic valve                         Value        Reference Pulmonic regurg velocity, ED           96.4  cm/s   ---------  Legend: (L)  and  (H)  mark values outside specified reference range.  ------------------------------------------------------------------- Prepared and Electronically Authenticated by  Ezra Shuck, M.D. 2016-08-16T17:48:28    CT SCANS  CT CORONARY MORPH W/CTA COR W/SCORE 11/24/2022  Addendum 12/19/2022 11:41 PM ADDENDUM REPORT: 12/19/2022 23:38  EXAM: OVER-READ INTERPRETATION  CT CHEST  The following report is an over-read performed by radiologist Dr. Oneil Devonshire of [facility] on 12/19/2022. This over-read does not include interpretation of cardiac or coronary anatomy or pathology. The coronary calcium  score/coronary CTA interpretation by the cardiologist is attached.  COMPARISON:  None.  FINDINGS:  Cardiovascular: There are no significant extracardiac vascular findings.  Mediastinum/Nodes: There are no enlarged lymph nodes within the visualized mediastinum.  Lungs/Pleura: There is no pleural effusion. The visualized lungs appear clear.  Upper abdomen: Small sliding-type hiatal hernia is noted.  Musculoskeletal/Chest wall: No chest wall mass or suspicious osseous findings within the visualized chest.  IMPRESSION: Hiatal hernia.   Electronically Signed By: Oneil Devonshire M.D. On: 12/19/2022 23:38  Narrative CLINICAL DATA:  Chest pain  EXAM: Cardiac/Coronary CTA  TECHNIQUE: A non-contrast, gated CT scan was obtained with axial slices of 3 mm through the heart for calcium  scoring. Calcium  scoring was performed using the Agatston method. A 120 kV prospective, gated, contrast cardiac scan was obtained. Gantry rotation speed was 250 msecs and collimation was 0.6 mm. Two sublingual nitroglycerin  tablets  (0.8 mg) were given. The 3D data set was reconstructed in 5% intervals of the 35-75% of the R-R cycle. Diastolic phases were analyzed on a dedicated workstation using MPR, MIP, and VRT modes. The patient received 95 cc of contrast.  FINDINGS: Image quality:Poor. Significant slab artifact is present. Significant blooming artifact.  Noise artifact is: Limited.  Coronary Arteries:  Normal coronary origin.  Right dominance.  Left main: The left main is a large caliber vessel with a normal take off from the left coronary cusp that bifurcates to form a left anterior descending artery and a left circumflex artery. There is no plaque or stenosis.  Left anterior descending artery: The LAD gives off 2 patent diagonal branches. There LAD is diffusely diseased and heavily calcified. There is severe calcified plaque in the proximal to mid LAD with associated stenosis of 70-99%.  Left circumflex artery: The LCX is non-dominant and gives off 2 patent obtuse marginal branches. The LCx is heavily calcified throughout the proximal and mid LCx with associated stenosis of at least 50-69%.  Right coronary artery: The RCA is dominant with normal take off from the right coronary cusp. The RCA is heavily calcified in the proximal and mid portion with associated stenosis of at least 50-69% but likely >70%. The RCA terminates as a PDA and right posterolateral branch without evidence of plaque or stenosis.  Right Atrium: Right atrial size is within normal limits.  Right Ventricle: The right ventricular cavity is within normal limits.  Left Atrium: Left atrial size is normal in size with no left atrial appendage filling defect.  Left Ventricle: The ventricular cavity size is within normal limits.  Pulmonary arteries: Normal in size.  Pulmonary veins: Normal pulmonary venous drainage.  Pericardium: Normal thickness without significant effusion or calcium  present.  Cardiac valves: The aortic  valve is trileaflet without significant calcification. The mitral valve is normal with moderate mitral annular calcifications.  Aorta: Normal caliber with scattered calcifications.  Extra-cardiac findings: See attached radiology report for non-cardiac structures.  IMPRESSION: 1. Coronary calcium  score of 2268. This was 99th percentile for age-, sex, and race-matched controls.  2. Total plaque volume 1260 mm3 which is 98th percentile for age- and sex-matched controls (calcified plaque 276mm3; non-calcified plaque 1072mm3). TPV is extensive.  3. Normal coronary origin with right dominance.  4. Severe atherosclerosis. Severe stenosis of the prox to mid LAD, moderate to severe stenosis of the prox to mid RCA and moderate stenosis of the LCx  5. Consider symptom-guided anti-ischemic and preventive pharmacotherapy as well as risk factor modification per guideline-directed care.  6. Recommend cardiac cath.  7. This study has been submitted for FFR analysis.  RECOMMENDATIONS: 1. CAD-RADS 0: No  evidence of CAD (0%). Consider non-atherosclerotic causes of chest pain.  2. CAD-RADS 1: Minimal non-obstructive CAD (0-24%). Consider non-atherosclerotic causes of chest pain. Consider preventive therapy and risk factor modification.  3. CAD-RADS 2: Mild non-obstructive CAD (25-49%). Consider non-atherosclerotic causes of chest pain. Consider preventive therapy and risk factor modification.  4. CAD-RADS 3: Moderate stenosis. Consider symptom-guided anti-ischemic pharmacotherapy as well as risk factor modification per guideline directed care. Additional analysis with CT FFR will be submitted.  5. CAD-RADS 4: Severe stenosis. (70-99% or > 50% left main). Cardiac catheterization or CT FFR is recommended. Consider symptom-guided anti-ischemic pharmacotherapy as well as risk factor modification per guideline directed care. Invasive coronary angiography recommended with revascularization  per published guideline statements.  6. CAD-RADS 5: Total coronary occlusion (100%). Consider cardiac catheterization or viability assessment. Consider symptom-guided anti-ischemic pharmacotherapy as well as risk factor modification per guideline directed care.  7. CAD-RADS N: Non-diagnostic study. Obstructive CAD can't be excluded. Alternative evaluation is recommended.  Wilbert Bihari, MD  Electronically Signed: By: Wilbert Bihari M.D. On: 11/25/2022 19:58          Risk Assessment/Calculations:     No BP recorded.  {Refresh Note OR Click here to enter BP  :1}***       Physical Exam:   VS:  There were no vitals taken for this visit.   Wt Readings from Last 3 Encounters:  12/17/22 133 lb (60.3 kg)  11/03/22 132 lb (59.9 kg)  10/08/22 135 lb (61.2 kg)    GEN: Well nourished, well developed in no acute distress NECK: No JVD; No carotid bruits CARDIAC: RRR, no murmurs, rubs, gallops RESPIRATORY:  Clear to auscultation without rales, wheezing or rhonchi  ABDOMEN: Soft, non-tender, non-distended EXTREMITIES:  No edema; No deformity   ASSESSMENT AND PLAN: .   CAD-coronary CTA revealed a calcium  score of 2268, placing her in the 99th percentile, FFR demonstrated possibly hemodynamic significance.  She is having episodes of chest pain, typically sharp in nature and relieved after approximately 1 minute.  Reviewed with Dr. Monetta, for now we will try to optimize her medical therapy. Start ASA 81 mg daily. Continue Crestor  20 mg daily. Start NTG PRN -- reviewed indications for how to take and when to present to the ED.  DOE - likely multifactorial, related to COPD, tobacco abuse, deconditioning from a recent fall.  She had a coronary CTA which did reveal coronary artery disease, FFR history of possible hemodynamic significance. We previously arranged an echocardiogram however she had to cancel this secondary to being hospitalized.  Will arrange for repeat echocardiogram, but it seems like  it is more likely related to her COPD.   COPD - not clear who follows this, she had stopped smoking but resumed with stressors she has been under, only smoking a third of a pack per day which is significantly decreased for her. On Trelegy and abluterol. If cardiac work up is unrevealing for causes of DOE, will refer or make sure she is seeing pulmonology.   Hypertension-blood pressure is 108/74, all of her antihypertensives were stopped by her PCP earlier today.  Moderate MR - last evaluated in 2022, will repeat echocardiogram for surveillance.   Dyslipidemia-most recent LDL was elevated at 140, we started on Crestor  approximately a month ago, will repeat FLP and LFTs.  Hypomagnesemia-magnesium was low at 1.1, she is starting magnesium supplementation today.  Will arrange to repeat her magnesium level in 1 week.  AKI on CKD-creatinine was elevated 2.70 on 12/15/2022, again  her antihypertensives have been discontinued.  Will repeat kidney function in 1 week.  Nephrotoxic agents have been discontinued.  Tobacco abuse-is trying to stop, is currently smoking a third a pack per day, she is aware of the deleterious effects this has on her body.       Dispo: Start aspirin  81 mg daily, nitroglycerin  as needed.  In 1 week we will repeat magnesium, CMET, fasting lipid panel.  Follow-up in 4 weeks.  Signed, Delon JAYSON Hoover, NP

## 2023-02-11 ENCOUNTER — Ambulatory Visit: Payer: Medicare Other | Attending: Cardiology | Admitting: Cardiology
